# Patient Record
Sex: Male | Born: 1955 | Race: Black or African American | Hispanic: No | Marital: Single | Smoking: Former smoker
Health system: Southern US, Community
[De-identification: ages and names within clinical notes are randomized; demographics above are authoritative.]

## PROBLEM LIST (undated history)

## (undated) DIAGNOSIS — C61 Malignant neoplasm of prostate: Secondary | ICD-10-CM

## (undated) DIAGNOSIS — I10 Essential (primary) hypertension: Secondary | ICD-10-CM

## (undated) DIAGNOSIS — I639 Cerebral infarction, unspecified: Secondary | ICD-10-CM

## (undated) HISTORY — PX: ROTATOR CUFF REPAIR: SHX139

## (undated) HISTORY — PX: OTHER SURGICAL HISTORY: SHX169

---

## 2004-02-29 ENCOUNTER — Encounter: Admission: RE | Admit: 2004-02-29 | Discharge: 2004-02-29 | Payer: Self-pay | Admitting: Family Medicine

## 2005-05-12 ENCOUNTER — Encounter: Admission: RE | Admit: 2005-05-12 | Discharge: 2005-05-12 | Payer: Self-pay | Admitting: General Surgery

## 2005-05-18 ENCOUNTER — Encounter: Admission: RE | Admit: 2005-05-18 | Discharge: 2005-05-18 | Payer: Self-pay | Admitting: General Surgery

## 2005-06-02 ENCOUNTER — Encounter (INDEPENDENT_AMBULATORY_CARE_PROVIDER_SITE_OTHER): Payer: Self-pay | Admitting: *Deleted

## 2005-06-02 ENCOUNTER — Ambulatory Visit (HOSPITAL_BASED_OUTPATIENT_CLINIC_OR_DEPARTMENT_OTHER): Admission: RE | Admit: 2005-06-02 | Discharge: 2005-06-02 | Payer: Self-pay | Admitting: General Surgery

## 2009-07-15 ENCOUNTER — Ambulatory Visit: Admission: RE | Admit: 2009-07-15 | Discharge: 2009-10-13 | Payer: Self-pay | Admitting: Radiation Oncology

## 2009-08-06 ENCOUNTER — Encounter: Admission: RE | Admit: 2009-08-06 | Discharge: 2009-08-06 | Payer: Self-pay | Admitting: Urology

## 2009-09-10 ENCOUNTER — Ambulatory Visit (HOSPITAL_BASED_OUTPATIENT_CLINIC_OR_DEPARTMENT_OTHER): Admission: RE | Admit: 2009-09-10 | Discharge: 2009-09-10 | Payer: Self-pay | Admitting: Urology

## 2009-10-13 ENCOUNTER — Ambulatory Visit
Admission: RE | Admit: 2009-10-13 | Discharge: 2009-10-19 | Payer: Self-pay | Source: Home / Self Care | Admitting: Radiation Oncology

## 2010-06-28 ENCOUNTER — Inpatient Hospital Stay (HOSPITAL_COMMUNITY)
Admission: EM | Admit: 2010-06-28 | Discharge: 2010-06-30 | DRG: 532 | Disposition: A | Discharge status: Home / Self Care | Payer: BC Managed Care – PPO | Attending: Internal Medicine | Admitting: Internal Medicine

## 2010-06-28 ENCOUNTER — Emergency Department (HOSPITAL_COMMUNITY): Payer: BC Managed Care – PPO

## 2010-06-28 DIAGNOSIS — I10 Essential (primary) hypertension: Secondary | ICD-10-CM | POA: Diagnosis present

## 2010-06-28 DIAGNOSIS — E860 Dehydration: Secondary | ICD-10-CM | POA: Diagnosis present

## 2010-06-28 DIAGNOSIS — N179 Acute kidney failure, unspecified: Secondary | ICD-10-CM | POA: Diagnosis present

## 2010-06-28 DIAGNOSIS — R4789 Other speech disturbances: Secondary | ICD-10-CM | POA: Diagnosis present

## 2010-06-28 DIAGNOSIS — G459 Transient cerebral ischemic attack, unspecified: Principal | ICD-10-CM | POA: Diagnosis present

## 2010-06-28 DIAGNOSIS — R55 Syncope and collapse: Secondary | ICD-10-CM | POA: Diagnosis present

## 2010-06-28 DIAGNOSIS — F17200 Nicotine dependence, unspecified, uncomplicated: Secondary | ICD-10-CM | POA: Diagnosis present

## 2010-06-28 DIAGNOSIS — G8190 Hemiplegia, unspecified affecting unspecified side: Secondary | ICD-10-CM | POA: Diagnosis present

## 2010-06-28 DIAGNOSIS — Z8546 Personal history of malignant neoplasm of prostate: Secondary | ICD-10-CM

## 2010-06-28 DIAGNOSIS — E785 Hyperlipidemia, unspecified: Secondary | ICD-10-CM | POA: Diagnosis present

## 2010-06-28 LAB — CBC
HCT: 49.6 % (ref 39.0–52.0)
Hemoglobin: 16.5 g/dL (ref 13.0–17.0)
MCH: 28.6 pg (ref 26.0–34.0)
MCHC: 33.3 g/dL (ref 30.0–36.0)
MCV: 86 fL (ref 78.0–100.0)
Platelets: 249 10*3/uL (ref 150–400)
RBC: 5.77 MIL/uL (ref 4.22–5.81)
RDW: 14.1 % (ref 11.5–15.5)
WBC: 6.7 10*3/uL (ref 4.0–10.5)

## 2010-06-28 LAB — T3: T3, Total: 79.7 ng/dl — ABNORMAL LOW (ref 80.0–204.0)

## 2010-06-28 LAB — DIFFERENTIAL
Basophils Absolute: 0 10*3/uL (ref 0.0–0.1)
Basophils Relative: 0 % (ref 0–1)
Eosinophils Absolute: 0 10*3/uL (ref 0.0–0.7)
Eosinophils Relative: 0 % (ref 0–5)
Lymphocytes Relative: 21 % (ref 12–46)
Lymphs Abs: 1.4 10*3/uL (ref 0.7–4.0)
Monocytes Absolute: 0.3 10*3/uL (ref 0.1–1.0)
Monocytes Relative: 5 % (ref 3–12)
Neutro Abs: 4.9 10*3/uL (ref 1.7–7.7)
Neutrophils Relative %: 73 % (ref 43–77)

## 2010-06-28 LAB — APTT: aPTT: 27 seconds (ref 24–37)

## 2010-06-28 LAB — PROTIME-INR
INR: 0.94 (ref 0.00–1.49)
Prothrombin Time: 12.8 seconds (ref 11.6–15.2)

## 2010-06-28 LAB — POCT I-STAT, CHEM 8
BUN: 25 mg/dL — ABNORMAL HIGH (ref 6–23)
Calcium, Ion: 1.18 mmol/L (ref 1.12–1.32)
Chloride: 108 mEq/L (ref 96–112)
Creatinine, Ser: 2.3 mg/dL — ABNORMAL HIGH (ref 0.4–1.5)
Glucose, Bld: 93 mg/dL (ref 70–99)
HCT: 57 % — ABNORMAL HIGH (ref 39.0–52.0)
Hemoglobin: 19.4 g/dL — ABNORMAL HIGH (ref 13.0–17.0)
Potassium: 4.5 mEq/L (ref 3.5–5.1)
Sodium: 142 mEq/L (ref 135–145)
TCO2: 24 mmol/L (ref 0–100)

## 2010-06-28 LAB — COMPREHENSIVE METABOLIC PANEL
ALT: 16 U/L (ref 0–53)
AST: 21 U/L (ref 0–37)
Albumin: 3.8 g/dL (ref 3.5–5.2)
Alkaline Phosphatase: 115 U/L (ref 39–117)
BUN: 18 mg/dL (ref 6–23)
CO2: 23 mEq/L (ref 19–32)
Calcium: 9.3 mg/dL (ref 8.4–10.5)
Chloride: 108 mEq/L (ref 96–112)
Creatinine, Ser: 1.7 mg/dL — ABNORMAL HIGH (ref 0.4–1.5)
GFR calc Af Amer: 51 mL/min — ABNORMAL LOW (ref >60)
GFR calc non Af Amer: 42 mL/min — ABNORMAL LOW (ref >60)
Glucose, Bld: 88 mg/dL (ref 70–99)
Potassium: 4.5 mEq/L (ref 3.5–5.1)
Sodium: 142 mEq/L (ref 135–145)
Total Bilirubin: 0.7 mg/dL (ref 0.3–1.2)
Total Protein: 7.4 g/dL (ref 6.0–8.3)

## 2010-06-28 LAB — HEMOGLOBIN A1C
Hgb A1c MFr Bld: 6 % — ABNORMAL HIGH (ref <5.7)
Mean Plasma Glucose: 126 mg/dL — ABNORMAL HIGH (ref <117)

## 2010-06-28 LAB — TSH: TSH: 0.583 u[IU]/mL (ref 0.350–4.500)

## 2010-06-28 LAB — CK TOTAL AND CKMB (NOT AT ARMC)
CK, MB: 1 ng/mL (ref 0.3–4.0)
Relative Index: INVALID (ref 0.0–2.5)
Total CK: 69 U/L (ref 7–232)

## 2010-06-28 LAB — T4: T4, Total: 8.5 ug/dL (ref 5.0–12.5)

## 2010-06-28 LAB — TROPONIN I: Troponin I: <0.01 ng/mL (ref 0.00–0.06)

## 2010-06-28 MED ORDER — GADOBENATE DIMEGLUMINE 529 MG/ML IV SOLN
10.0000 mL | Freq: Once | INTRAVENOUS | Status: AC
  Administered 2010-06-28: 10 mL via INTRAVENOUS

## 2010-06-28 NOTE — H&P (Signed)
NAME:  Jermaine Lowery, Jermaine Lowery NO.:  [id number]  MEDICAL RECORD NO.:  [id number]           PATIENT TYPE:  E  LOCATION:  [facility]                         FACILITY:  [facility]  PHYSICIAN:  Talmage Nap, MD  DATE OF BIRTH:  [birth year]  DATE OF ADMISSION:  06/28/2010 DATE OF DISCHARGE:                             HISTORY & PHYSICAL   PRIMARY CARE PHYSICIAN:  [facility].  History obtainable from the patient and the patient's sister.  CHIEF COMPLAINT:  Dizziness and diaphoresis, which occurred at work about 9:00 a.m. on June 28, 2010.  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  When the patient developed these symptoms, he temporarily stopped driving and came down from his truck.  He denied any associated chest pain.  He denied any shortness of breath.  He denied any nausea or vomiting.  He denied any fever.  No chills.  No rigor.  He also denied any slurred speech; however, the patient's sister claimed that the patient's speech was slurred.  He denied any history of passing out.  Symptoms were said to have been very transient and subsequently, the patient was brought to the emergency room to be evaluated.  PAST MEDICAL HISTORY:  Positive for hypertension, not on any medications; hyperlipidemia, not on any medication; prostate CA.  PAST SURGICAL HISTORY: 1. Prostate CA status post radiotherapy and seed implant. 2. Bilateral foot bone spur status post removal. 3. Bilateral rotator cuff surgery. 4. Left knee arthroscopy.  MEDICATIONS:  He is currently not on any medication.  ALLERGIES:  TO NAPROXEN, WHICH PRODUCES HIVES.  FAMILY  HISTORY:  York Spaniel to be positive for diabetes mellitus, hypertension, CVA, and stroke.  SOCIAL HISTORY:  The patient smokes about quarter of a pack of cigarettes per day for over 35 years.  Occasionally takes alcohol. 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  REVIEW OF SYSTEMS:  The patient presently denies any history of headaches.  No blurred vision.  No nausea or vomiting.  No fever.  No chills.  No rigor.  No chest pain or shortness of breath.  No cough.  No abdominal discomfort.  No diarrhea or hematochezia.  No dysuria or hematuria.  No swelling of the lower extremity.  No intolerance to heat or cold and no known psychiatric disorder.  PHYSICAL EXAMINATION:  GENERAL:  Middle-aged man with suboptimal hydration, not in any obvious respiratory distress. VITAL SIGNS:  His present vital signs, blood pressure is 103/76, pulse is 65, respiratory rate 18, temperature is 97.5. HEENT:  Pupils are reactive to light.  Extraocular muscles are intact. NECK:  He has no jugular venous distention.  No carotid bruit.  No lymphadenopathy. CHEST:  Clear to auscultation. HEART:  Heart sounds are 1 and 2. ABDOMEN:  Soft and nontender.  Liver, spleen, kidney, not palpable. Bowel sounds are positive.  EXTREMITIES:  No pedal edema. NEUROLOGIC EXAM:  Shows speech to be appropriate.  Muscle power, right upper extremity 4/5, right lower extremity 4/5, left upper extremity 5/5, left lower extremity 5/5.  Sensory sensation intact and deep tendon reflexes intact and no clonus.  MUSCULOSKELETAL SYSTEM:  Shows arthritic changes in the knees. SKIN:  Showed decreased turgor.  LABORATORY DATA:  Chemistry done on the patient showed sodium of 142, potassium of 4.5, chloride of 118, BUN 25, creatinine is 2.3, sodium is 93.  Hemoglobin is 19.4 and hematocrit is 57.0, most likely secondary to hemoconcentration.  EKG showed sinus rhythm with a rate of 65 with LVH. No acute ST-wave change noted.  CT of the head  without contrast showed chronic lacunar infarction in the right anterior basilar ganglia, internal capsule on the left periventricular white matter.  There is no significant ischemic changes seen and there is no acute infarct seen.  IMPRESSION: 1. Dizziness/questionable cerebrovascular accident with right     hemiparesis. 2. Hypertension. 3. Hyperlipidemia. 4. Chronic kidney disease (baseline creatinine, unknown).  PLAN:  Admit the patient to Neuro Telemetry.  The patient will be followed strictly on the stroke admission orders and part of it will include lipid panel, hemoglobin A1c, TSH, T3, and T4.  The patient will have CBCD stat done and cardiac enzymes q. 6 x3.  Imaging studies to be ordered on this patient will include MRI and MRA of the head and neck, carotid duplex, and 2-D echo.  In the meantime, the patient will be given half-normal saline IV to go at rate of 75 mL an hour to correct his dehydration.  He will also be on aspirin 325 mg p.o. daily, Zocor 20 mg p.o. daily, and amlodipine 5 mg p.o. daily.  He will be on Protonix 40 mg IV q. 24 for GI prophylaxis and heparin 5000 inches subcu q. 8 hourly for DVT prophylaxis.  The patient will be reevaluated with lab results and he will be followed on a daily basis.     Talmage Nap, MD     CN/MEDQ  D:  06/28/2010  T:  06/28/2010  Job:  [id number]  Electronically Signed by Talmage Nap  on 06/28/2010 04:20:22 PM

## 2010-06-29 DIAGNOSIS — I6789 Other cerebrovascular disease: Secondary | ICD-10-CM

## 2010-06-29 LAB — CBC
HCT: 44.5 % (ref 39.0–52.0)
MCV: 85.2 fL (ref 78.0–100.0)
Platelets: 249 10*3/uL (ref 150–400)
RBC: 5.22 MIL/uL (ref 4.22–5.81)
WBC: 4.6 10*3/uL (ref 4.0–10.5)

## 2010-06-29 LAB — LIPID PANEL
Cholesterol: 196 mg/dL (ref 0–200)
HDL: 47 mg/dL (ref >39)
LDL Cholesterol: 133 mg/dL — ABNORMAL HIGH (ref 0–99)
Triglycerides: 81 mg/dL (ref <150)

## 2010-06-29 LAB — BASIC METABOLIC PANEL
BUN: 15 mg/dL (ref 6–23)
Chloride: 105 mEq/L (ref 96–112)
Glucose, Bld: 159 mg/dL — ABNORMAL HIGH (ref 70–99)
Potassium: 3.9 mEq/L (ref 3.5–5.1)

## 2010-06-29 LAB — CARDIAC PANEL(CRET KIN+CKTOT+MB+TROPI)
CK, MB: 1 ng/mL (ref 0.3–4.0)
Relative Index: INVALID (ref 0.0–2.5)
Total CK: 78 U/L (ref 7–232)
Troponin I: <0.01 ng/mL (ref 0.00–0.06)

## 2010-06-30 LAB — CBC
HCT: 43.4 % (ref 39.0–52.0)
Hemoglobin: 14.3 g/dL (ref 13.0–17.0)
MCH: 27.9 pg (ref 26.0–34.0)
MCV: 84.6 fL (ref 78.0–100.0)
Platelets: 226 10*3/uL (ref 150–400)
RBC: 5.13 MIL/uL (ref 4.22–5.81)

## 2010-06-30 LAB — BASIC METABOLIC PANEL
CO2: 24 mEq/L (ref 19–32)
Chloride: 110 mEq/L (ref 96–112)
Creatinine, Ser: 1.3 mg/dL (ref 0.4–1.5)
GFR calc Af Amer: >60 mL/min (ref >60)

## 2010-07-13 NOTE — Discharge Summary (Signed)
NAME:  Jermaine Lowery, Jermaine Lowery NO.:  [id number]  MEDICAL RECORD NO.:  [id number]           PATIENT TYPE:  I  LOCATION:  3040                         FACILITY:  [facility]  PHYSICIAN:  Jermaine Ranger, MD       DATE OF BIRTH:  04-30-1956  DATE OF ADMISSION:  06/28/2010 DATE OF DISCHARGE:  06/30/2010                              DISCHARGE SUMMARY   PRIMARY CARE PHYSICIAN:  [facility].  DISCHARGE DIAGNOSIS: 1. Near syncope with questionable transient ischemic attack. 2. Hypertension. 3. Hyperlipidemia. 4. Dehydration. 5. History of prostate cancer status post radiotherapy and seed     implant. 6. Hyperlipidemia.  DISCHARGE MEDICATIONS: 1. Multivitamin 1 tablet daily. 2. Pravachol 40 mg p.o. daily. 3. Amlodipine 5 mg p.o. daily. 4. Aspirin 325 mg p.o. daily.  HISTORY OF PRESENT ILLNESS:  Mr. 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 The patient stopped driving temporally and came down from his truck.  He denied any history of syncopal episode.  The patient's sister, however, claimed that the patient's speech was somewhat slurred, although he denied any slurring of speech.  RADIOLOGICAL DATA:  CT head without contrast February 28, significant cerebrovascular disease since 2005, no acute infarct.  MRI of the brain showed no acute intracranial abnormality, multifocal chronic small vessel infarct, abnormal bone marrow signal in the cervical spine, is nonspecific.  MRA, minimal bilateral carotid bifurcation, atherosclerosis with no significant cervical carotid artery stenosis. Dominant left vertebral artery is within normal limits, moderate stenosis of the __________ nondominant right vertebral artery, posterior circulation, otherwise negative.  A 2-D  echocardiogram, EF 55-65%, systolic function normal.  Carotid Doppler's were done which showed no bilateral ICA stenosis.  BRIEF HOSPITALIZATION COURSE:  Mr. Jermaine Lowery is a 55 year old male who was admitted with near syncopal episode.  However, at the time of admission there was question of TIA. 1. Near syncope versus TIA, resolved.  The patient underwent stroke     workup which included MRI, MRA which showed no acute infarct.     However, the patient has developed a significant cerebrovascular     disease with chronic ischemia since previous test in 2005.  No     acute infarct.  The patient was started on full-dose aspirin and     should continue in the light of significant cerebrovascular     disease. 2. Hypertension.  The patient was started on amlodipine 5 mg p.o.     daily and his blood pressure has been stable. 3. Hyperlipidemia.  LDL was found to be 133.  The patient was strongly     counseled for diet control as well as started on Pravachol 40 mg     p.o. daily. 4. A PT/OT consult was also done which shows that the patient is back     to his baseline and does not require any assistance.  DISCHARGE PHYSICAL EXAMINATION:  VITAL SIGNS:  Temperature 98, pulse 60,  respirations 20, blood pressure 134/73, O2 sats 98% room air. GENERAL:  The patient is alert, awake and oriented x3 not in acute distress. HEENT:  Anicteric sclerae.  Pink conjunctivae.  Pupils reactive to light and accommodation.  EOMI. NECK:  Supple.  No lymphadenopathy, no JVD. CVS:  S1, S2 clear.  Regular rate and rhythm. CHEST:  Clear to auscultation bilaterally. ABDOMEN:  Soft and nondistended.  Normal bowel sounds. EXTREMITIES:  No cyanosis, clubbing, or edema noted in upper or lower extremities bilaterally.  DISCHARGE FOLLOWUP:  With [facility] within next 1- 2 weeks.  DISCHARGE TIME:  35 minutes.     Jermaine Ranger, MD     RR/MEDQ  D:  06/30/2010  T:  07/01/2010  Job:   [id number]  cc:   Dr. Renato Lowery  Electronically Signed by Jermaine Lowery  on 07/13/2010 07:50:20 AM

## 2010-07-19 LAB — COMPREHENSIVE METABOLIC PANEL
ALT: 21 U/L (ref 0–53)
AST: 25 U/L (ref 0–37)
Albumin: 4 g/dL (ref 3.5–5.2)
Alkaline Phosphatase: 89 U/L (ref 39–117)
BUN: 18 mg/dL (ref 6–23)
CO2: 29 mEq/L (ref 19–32)
Calcium: 9.2 mg/dL (ref 8.4–10.5)
Chloride: 107 mEq/L (ref 96–112)
Creatinine, Ser: 1.27 mg/dL (ref 0.4–1.5)
GFR calc Af Amer: >60 mL/min (ref >60)
GFR calc non Af Amer: 60 mL/min — ABNORMAL LOW (ref >60)
Glucose, Bld: 101 mg/dL — ABNORMAL HIGH (ref 70–99)
Potassium: 4.5 mEq/L (ref 3.5–5.1)
Sodium: 142 mEq/L (ref 135–145)
Total Bilirubin: 0.5 mg/dL (ref 0.3–1.2)
Total Protein: 7.3 g/dL (ref 6.0–8.3)

## 2010-07-19 LAB — CBC
HCT: 44.4 % (ref 39.0–52.0)
Hemoglobin: 14.4 g/dL (ref 13.0–17.0)
MCHC: 32.4 g/dL (ref 30.0–36.0)
MCV: 90.5 fL (ref 78.0–100.0)
Platelets: 231 10*3/uL (ref 150–400)
RBC: 4.9 MIL/uL (ref 4.22–5.81)
RDW: 14.8 % (ref 11.5–15.5)
WBC: 5.8 10*3/uL (ref 4.0–10.5)

## 2010-07-19 LAB — PROTIME-INR
INR: 0.95 (ref 0.00–1.49)
Prothrombin Time: 12.6 seconds (ref 11.6–15.2)

## 2010-07-19 LAB — APTT: aPTT: 28 seconds (ref 24–37)

## 2010-09-16 NOTE — Op Note (Signed)
NAME:  Jermaine Lowery, Jermaine Lowery NO.:  [id number]   MEDICAL RECORD NO.:  [id number]          PATIENT TYPE:  AMB   LOCATION:  [facility]                         [facility]ACILITY:  [facility]   PHYSICIAN:  Leonie Man, M.D.   DATE OF BIRTH:  1956/04/08   DATE OF PROCEDURE:  06/02/2005  DATE OF DISCHARGE:                                 OPERATIVE REPORT   PREOPERATIVE DIAGNOSIS:  Mass left temple region.   POSTOPERATIVE DIAGNOSIS:  Excision of lipoma, left temple region.   SURGEON:  Leonie Man, M.D.   ASSISTANT:  OR tech.   ANESTHESIA:  General.   INDICATIONS:  The patient is a 55 year old man with an enlarging soft mass  behind his left ear.  This is not painful but has been causing some  significant deformity in his scalp.  The patient comes to the operating room  for excision of this mass after the risks and potential benefits of surgery  have been discussed.  All questions answered and consent obtained.   DESCRIPTION OF PROCEDURE:  With the patient positioned supine and then  turned into right lateral recumbent position, following the induction of  satisfactory general anesthesia, the area behind the left ear was prepped  and draped in the sterile operative field.  I infiltrated this with 0.5%  Marcaine with epinephrine and then made an elliptical incision over the  mastia down through the skin and subcutaneous tissues to the capsule of a  large lipoma which is dissected free from the surrounding tissue and removed  in its entirety down to the muscles of the scalp.  Hemostasis was then  obtained with electrocautery.  Sponge and instrument counts were verified  and the incision closed with two layers using interrupted 0 Vicryl sutures  in the subcutaneous tissues and a running 4-0 Vicryl suture in the skin.  This has been reinforced with Steri-Strips and a sterile compressive  dressing applied.  Anesthetic reversed.  The patient removed from the  operating room to the recovery  room in stable condition.  He tolerated the  procedure well.   Dictation ends here.      Leonie Man, M.D.  Electronically Signed     PB/MEDQ  D:  06/02/2005  T:  06/02/2005  Job:  [id number]

## 2013-02-03 ENCOUNTER — Encounter (HOSPITAL_COMMUNITY): Payer: Self-pay | Admitting: *Deleted

## 2013-02-03 ENCOUNTER — Observation Stay (HOSPITAL_COMMUNITY)
Admission: EM | Admit: 2013-02-03 | Discharge: 2013-02-05 | Disposition: A | Discharge status: Home / Self Care | Payer: BC Managed Care – PPO | Attending: Internal Medicine | Admitting: Internal Medicine

## 2013-02-03 ENCOUNTER — Emergency Department (HOSPITAL_COMMUNITY): Payer: BC Managed Care – PPO

## 2013-02-03 ENCOUNTER — Observation Stay (HOSPITAL_COMMUNITY): Payer: BC Managed Care – PPO

## 2013-02-03 DIAGNOSIS — N19 Unspecified kidney failure: Secondary | ICD-10-CM

## 2013-02-03 DIAGNOSIS — R4789 Other speech disturbances: Secondary | ICD-10-CM | POA: Insufficient documentation

## 2013-02-03 DIAGNOSIS — R262 Difficulty in walking, not elsewhere classified: Secondary | ICD-10-CM | POA: Insufficient documentation

## 2013-02-03 DIAGNOSIS — F17200 Nicotine dependence, unspecified, uncomplicated: Secondary | ICD-10-CM | POA: Insufficient documentation

## 2013-02-03 DIAGNOSIS — N179 Acute kidney failure, unspecified: Secondary | ICD-10-CM | POA: Insufficient documentation

## 2013-02-03 DIAGNOSIS — R32 Unspecified urinary incontinence: Secondary | ICD-10-CM | POA: Insufficient documentation

## 2013-02-03 DIAGNOSIS — E785 Hyperlipidemia, unspecified: Secondary | ICD-10-CM | POA: Insufficient documentation

## 2013-02-03 DIAGNOSIS — I10 Essential (primary) hypertension: Secondary | ICD-10-CM | POA: Insufficient documentation

## 2013-02-03 DIAGNOSIS — G901 Familial dysautonomia [Riley-Day]: Secondary | ICD-10-CM

## 2013-02-03 DIAGNOSIS — G459 Transient cerebral ischemic attack, unspecified: Principal | ICD-10-CM | POA: Insufficient documentation

## 2013-02-03 HISTORY — DX: Essential (primary) hypertension: I10

## 2013-02-03 HISTORY — DX: Cerebral infarction, unspecified: I63.9

## 2013-02-03 HISTORY — DX: Malignant neoplasm of prostate: C61

## 2013-02-03 LAB — COMPREHENSIVE METABOLIC PANEL
ALT: 22 U/L (ref 0–53)
Albumin: 4.2 g/dL (ref 3.5–5.2)
Alkaline Phosphatase: 128 U/L — ABNORMAL HIGH (ref 39–117)
BUN: 19 mg/dL (ref 6–23)
Chloride: 102 mEq/L (ref 96–112)
Glucose, Bld: 108 mg/dL — ABNORMAL HIGH (ref 70–99)
Potassium: 4.1 mEq/L (ref 3.5–5.1)
Sodium: 141 mEq/L (ref 135–145)
Total Bilirubin: 0.4 mg/dL (ref 0.3–1.2)

## 2013-02-03 LAB — GLUCOSE, CAPILLARY
Glucose-Capillary: 115 mg/dL — ABNORMAL HIGH (ref 70–99)
Glucose-Capillary: 187 mg/dL — ABNORMAL HIGH (ref 70–99)

## 2013-02-03 LAB — CBC WITH DIFFERENTIAL/PLATELET
Basophils Relative: 0 % (ref 0–1)
Hemoglobin: 17.3 g/dL — ABNORMAL HIGH (ref 13.0–17.0)
Lymphs Abs: 1.5 10*3/uL (ref 0.7–4.0)
Monocytes Relative: 8 % (ref 3–12)
Neutro Abs: 6.7 10*3/uL (ref 1.7–7.7)
Neutrophils Relative %: 75 % (ref 43–77)
Platelets: 241 10*3/uL (ref 150–400)
RBC: 5.87 MIL/uL — ABNORMAL HIGH (ref 4.22–5.81)

## 2013-02-03 MED ORDER — VITAMIN B-1 100 MG PO TABS
100.0000 mg | ORAL_TABLET | Freq: Every day | ORAL | Status: DC
  Administered 2013-02-04 – 2013-02-05 (×2): 100 mg via ORAL
  Filled 2013-02-03 (×2): qty 1

## 2013-02-03 MED ORDER — ADULT MULTIVITAMIN W/MINERALS CH
1.0000 | ORAL_TABLET | Freq: Every day | ORAL | Status: DC
  Filled 2013-02-03: qty 1

## 2013-02-03 MED ORDER — AMLODIPINE BESYLATE 5 MG PO TABS
5.0000 mg | ORAL_TABLET | Freq: Every day | ORAL | Status: DC
  Administered 2013-02-04 – 2013-02-05 (×2): 5 mg via ORAL
  Filled 2013-02-03 (×2): qty 1

## 2013-02-03 MED ORDER — SODIUM CHLORIDE 0.9 % IV SOLN
INTRAVENOUS | Status: DC
  Administered 2013-02-03: 1000 mL via INTRAVENOUS

## 2013-02-03 MED ORDER — LORAZEPAM 1 MG PO TABS: 0.0000 mg | ORAL_TABLET | Freq: Four times a day (QID) | ORAL | Status: DC

## 2013-02-03 MED ORDER — THIAMINE HCL 100 MG/ML IJ SOLN
100.0000 mg | Freq: Every day | INTRAMUSCULAR | Status: DC
  Filled 2013-02-03 (×2): qty 1

## 2013-02-03 MED ORDER — ADULT MULTIVITAMIN W/MINERALS CH
1.0000 | ORAL_TABLET | Freq: Every day | ORAL | Status: DC
  Administered 2013-02-04 – 2013-02-05 (×2): 1 via ORAL
  Filled 2013-02-03 (×2): qty 1

## 2013-02-03 MED ORDER — HYDRALAZINE HCL 20 MG/ML IJ SOLN
10.0000 mg | INTRAMUSCULAR | Status: DC | PRN
Start: 2013-02-03 — End: 2013-02-05

## 2013-02-03 MED ORDER — LORAZEPAM 1 MG PO TABS: 0.0000 mg | ORAL_TABLET | Freq: Two times a day (BID) | ORAL | Status: DC

## 2013-02-03 MED ORDER — LORAZEPAM 1 MG PO TABS: 1.0000 mg | ORAL_TABLET | Freq: Four times a day (QID) | ORAL | Status: DC | PRN

## 2013-02-03 MED ORDER — ASPIRIN 325 MG PO TABS
325.0000 mg | ORAL_TABLET | Freq: Every day | ORAL | Status: DC
  Administered 2013-02-04 – 2013-02-05 (×2): 325 mg via ORAL
  Filled 2013-02-03 (×2): qty 1

## 2013-02-03 MED ORDER — LORAZEPAM 2 MG/ML IJ SOLN: 1.0000 mg | Freq: Four times a day (QID) | INTRAMUSCULAR | Status: DC | PRN

## 2013-02-03 MED ORDER — ENOXAPARIN SODIUM 40 MG/0.4ML ~~LOC~~ SOLN
40.0000 mg | SUBCUTANEOUS | Status: DC
  Administered 2013-02-03 – 2013-02-04 (×2): 40 mg via SUBCUTANEOUS
  Filled 2013-02-03 (×3): qty 0.4

## 2013-02-03 MED ORDER — FOLIC ACID 1 MG PO TABS
1.0000 mg | ORAL_TABLET | Freq: Every day | ORAL | Status: DC
  Administered 2013-02-04 – 2013-02-05 (×2): 1 mg via ORAL
  Filled 2013-02-03 (×2): qty 1

## 2013-02-03 MED ORDER — ACETAMINOPHEN 325 MG PO TABS: 650.0000 mg | ORAL_TABLET | ORAL | Status: DC | PRN

## 2013-02-03 MED ORDER — ATORVASTATIN CALCIUM 20 MG PO TABS
20.0000 mg | ORAL_TABLET | Freq: Every day | ORAL | Status: DC
  Administered 2013-02-04 – 2013-02-05 (×2): 20 mg via ORAL
  Filled 2013-02-03 (×2): qty 1

## 2013-02-03 NOTE — ED Notes (Signed)
Pt taken to mri

## 2013-02-03 NOTE — H&P (Signed)
[facility] History and Physical  Makih Stefanko ZOX:[id number] DOB: [birth year] DOA: 02/03/2013  Referring physician: ER physician. PCP: No primary provider on file. Dr. Renato Gails. [facility].  Chief Complaint: Blurred vision and slurred speech.  HPI: Jermaine Lowery is a 57 y.o. male with known history of hypertension and hyperlipidemia and ongoing tobacco abuse with previous history of TIA was brought to the ER after patient has had blurred vision with slurred speech and difficulty walking. Around 4 PM today patient while doing his laundry started experiencing blurred vision in both eyes. When his sister came around 5 PM to visit she noticed that patient also had some difficulty speaking with slurring of speech. Patient also had some difficulty walking with gait imbalance. Symptoms improved by 5:30 PM. In the ER patient was found to be nonfocal. CT head was negative for anything acute. Patient has been admitted for further TIA workup. Neurologist has been consulted. Patient otherwise denies any chest pain shortness of breath. Denies any weakness of the upper or lower extremities or any headache or difficulty swallowing or any incontinence of urine or bowel.  Review of Systems: As presented in the history of presenting illness, rest negative.  Past Medical History  Diagnosis Date  . Stroke   . Hypertension   . Prostate cancer    Past Surgical History  Procedure Laterality Date  . Rotator cuff repair    . Prostate cacner     Social History:  reports that he has been smoking.  He does not have any smokeless tobacco history on file. He reports that he does not drink alcohol or use illicit drugs. Home. where does patient live-- Can do ADLs. Can patient participate in ADLs?  Allergies  Allergen Reactions  . Naproxen Rash    Family History  Problem Relation Age of Onset  . CAD Mother   . Diabetes Mellitus II Mother   . Diabetes Mellitus II Brother   . Stroke  Paternal Uncle       Prior to Admission medications   Medication Sig Start Date End Date Taking? Authorizing Provider  amLODipine (NORVASC) 5 MG tablet Take 5 mg by mouth daily.   Yes Historical Provider, MD  aspirin EC 81 MG tablet Take 81 mg by mouth daily.   Yes Historical Provider, MD  atorvastatin (LIPITOR) 20 MG tablet Take 20 mg by mouth daily.   Yes Historical Provider, MD  Multiple Vitamin (MULTIVITAMIN WITH MINERALS) TABS tablet Take 1 tablet by mouth daily.   Yes Historical Provider, MD   Physical Exam: Filed Vitals:   02/03/13 1740 02/03/13 1915 02/03/13 1944  BP: 141/86 170/106   Pulse: 76    Temp: 98.6 F (37 C) 98.1 F (36.7 C) 98 F (36.7 C)  TempSrc: Oral Oral   Resp: 18 15   Weight: 72.774 kg (160 lb 7 oz)    SpO2: 99% 100%      General:  Well-developed and nourished.  Eyes: Anicteric no pallor.  ENT: No discharge from ears eyes nose mouth.  Neck: No mass felt.  Cardiovascular: S1-S2 heard.  Respiratory: No rhonchi or crepitations.  Abdomen: Soft nontender bowel sounds present.  Skin: No rash.  Musculoskeletal: No edema.  Psychiatric: Appears normal.  Neurologic: Alert awake oriented to time place and person. Moves all extremities 5 x 5. No facial asymmetry. Tongue is midline. PERRLA positive.  Labs on Admission:  Basic Metabolic Panel:  Recent Labs Lab 02/03/13 1746  NA 141  K 4.1  CL 102  CO2 23  GLUCOSE 108*  BUN 19  CREATININE 1.74*  CALCIUM 9.6   Liver Function Tests:  Recent Labs Lab 02/03/13 1746  AST 23  ALT 22  ALKPHOS 128*  BILITOT 0.4  PROT 8.3  ALBUMIN 4.2   No results found for this basename: LIPASE, AMYLASE,  in the last 168 hours No results found for this basename: AMMONIA,  in the last 168 hours CBC:  Recent Labs Lab 02/03/13 1746  WBC 8.9  NEUTROABS 6.7  HGB 17.3*  HCT 48.6  MCV 82.8  PLT 241   Cardiac Enzymes: No results found for this basename: CKTOTAL, CKMB, CKMBINDEX, TROPONINI,  in the  last 168 hours  BNP (last 3 results) No results found for this basename: PROBNP,  in the last 8760 hours CBG:  Recent Labs Lab 02/03/13 1744  GLUCAP 115*    Radiological Exams on Admission: Ct Head Wo Contrast  02/03/2013   CLINICAL DATA:  Sudden nausea and dizziness with blurry vision started about 2 hr ago  EXAM: CT HEAD WITHOUT CONTRAST  TECHNIQUE: Contiguous axial images were obtained from the base of the skull through the vertex without intravenous contrast.  COMPARISON:  06/28/2010  FINDINGS: There are chronic lacunar infarcts in the bilateral basal ganglia. The majority of these were present previously. A few lacunar infarcts are new when compared to the prior study but nonetheless demonstrate a chronic appearance. There is no evidence of vascular territory infarct. There is no hemorrhage or extra-axial fluid.  IMPRESSION: Chronic ischemic change with numerous chronic deep white matter and basal ganglia lacunar infarcts.   Electronically Signed   By: Esperanza Heir M.D.   On: 02/03/2013 18:36    EKG: Independently reviewed. Normal sinus rhythm.  Assessment/Plan Principal Problem:   TIA (transient ischemic attack) Active Problems:   Renal failure   HTN (hypertension)   HLD (hyperlipidemia)   1. Possible TIA - neurology has been consulted. Patient has been placed on neurochecks. MRI/MRA brain 2-D echo and carotid Dopplers has been ordered. Aspirin. Further recommendations per neurologist. 2. Hypertension uncontrolled - patient is on amlodipine. Patient also has been placed on when necessary IV hydralazine for systolic blood pressure more than 220. Since patient may be having an acute event of possible stroke permissive hypertension. 3. Renal failure probably chronic - UA is pending. Closely follow metabolic panel. 4. Hyperlipidemia - check lipid panel. 5. Tobacco abuse - strongly advised to quit smoking.  Questionable history of alcoholism. Family states he has been drinking a  lot of alcohol, though patient denies. Patient has been placed on plan Ativan.    Code Status: Full code.  Family Communication: Family at the bedside.  Disposition Plan: Admit for observation.    [name] N. [facility] Pager [phone].  If 7PM-7AM, please contact night-coverage [email or web address] Password [email or web address] 02/03/2013, 8:39 PM

## 2013-02-03 NOTE — ED Notes (Signed)
Pt in MRI.

## 2013-02-03 NOTE — ED Notes (Signed)
MD Walden at bedside 

## 2013-02-03 NOTE — ED Provider Notes (Signed)
CSN: [id number]     Arrival date & time 02/03/13  1730 History   First MD Initiated Contact with Patient 02/03/13 1856     Chief Complaint  Patient presents with  . Dizziness   (Consider location/radiation/quality/duration/timing/severity/associated sxs/prior Treatment) HPI Comments: One episode of dizziness, blurry vision in both eyes, nausea earlier today. Had some associated slurred speech and urinary incontinence also.  Patient is a 57 y.o. male presenting with neurologic complaint. The history is provided by the patient.  Neurologic Problem This is a recurrent problem. The current episode started 1 to 2 hours ago. Episode frequency: once. The problem has been resolved. Pertinent negatives include no chest pain, no abdominal pain, no headaches and no shortness of breath. Nothing aggravates the symptoms. Nothing relieves the symptoms.    Past Medical History  Diagnosis Date  . Stroke   . Hypertension    History reviewed. No pertinent past surgical history. No family history on file. History  Substance Use Topics  . Smoking status: Current Every Day Smoker  . Smokeless tobacco: Not on file  . Alcohol Use: No    Review of Systems  Constitutional: Negative for fever.  Respiratory: Negative for cough and shortness of breath.   Cardiovascular: Negative for chest pain.  Gastrointestinal: Negative for vomiting and abdominal pain.  Neurological: Negative for headaches.  All other systems reviewed and are negative.    Allergies  Naproxen  Home Medications  No current outpatient prescriptions on file. BP 170/106  Pulse 76  Temp(Src) 98.1 F (36.7 C) (Oral)  Resp 15  Wt 160 lb 7 oz (72.774 kg)  SpO2 100% Physical Exam  Nursing note and vitals reviewed. Constitutional: He is oriented to person, place, and time. He appears well-developed and well-nourished. No distress.  HENT:  Head: Normocephalic and atraumatic.  Mouth/Throat: No oropharyngeal exudate.  Eyes: EOM are  normal. Pupils are equal, round, and reactive to light.  Neck: Normal range of motion. Neck supple.  Cardiovascular: Normal rate and regular rhythm.  Exam reveals no friction rub.   No murmur heard. Pulmonary/Chest: Effort normal and breath sounds normal. No respiratory distress. He has no wheezes. He has no rales.  Abdominal: He exhibits no distension. There is no tenderness. There is no rebound.  Musculoskeletal: Normal range of motion. He exhibits no edema.  Neurological: He is alert and oriented to person, place, and time. No cranial nerve deficit. He exhibits normal muscle tone. Coordination normal.  Skin: He is not diaphoretic.    ED Course  Procedures (including critical care time) Labs Review Labs Reviewed  CBC WITH DIFFERENTIAL - Abnormal; Notable for the following:    RBC 5.87 (*)    Hemoglobin 17.3 (*)    All other components within normal limits  COMPREHENSIVE METABOLIC PANEL - Abnormal; Notable for the following:    Glucose, Bld 108 (*)    Creatinine, Ser 1.74 (*)    Alkaline Phosphatase 128 (*)    GFR calc non Af Amer 42 (*)    GFR calc Af Amer 49 (*)    All other components within normal limits  GLUCOSE, CAPILLARY - Abnormal; Notable for the following:    Glucose-Capillary 115 (*)    All other components within normal limits   Imaging Review Ct Head Wo Contrast  02/03/2013   CLINICAL DATA:  Sudden nausea and dizziness with blurry vision started about 2 hr ago  EXAM: CT HEAD WITHOUT CONTRAST  TECHNIQUE: Contiguous axial images were obtained from the base of the  skull through the vertex without intravenous contrast.  COMPARISON:  06/28/2010  FINDINGS: There are chronic lacunar infarcts in the bilateral basal ganglia. The majority of these were present previously. A few lacunar infarcts are new when compared to the prior study but nonetheless demonstrate a chronic appearance. There is no evidence of vascular territory infarct. There is no hemorrhage or extra-axial fluid.   IMPRESSION: Chronic ischemic change with numerous chronic deep white matter and basal ganglia lacunar infarcts.   Electronically Signed   By: Esperanza Heir M.D.   On: 02/03/2013 18:36    MDM   1. TIA (transient ischemic attack)   2. HLD (hyperlipidemia)   3. HTN (hypertension)   4. Renal failure    50M with hx of EtOH use, HTN, TIA presents with one episode of blurry vision, dizziness, nausea, and slurred speech. Patient states this resolved. Per sisters, he also had an episode of urinary incontinence. All sympoms have resolved. AFVSS here, relaxing comfortably. Not exerting himself initially during this episode. Patient with normal cranial nerves, normal strength and sensation in all extremities, normal coordination. Initial Head CT normal. MR ordered, medicine consulted for TIA and admitting.     Dagmar Hait, MD 02/04/13 0040

## 2013-02-03 NOTE — ED Provider Notes (Signed)
MSE was initiated and I personally evaluated the patient and placed orders (if any) at  5:50 PM on February 03, 2013.  The patient appears stable so that the remainder of the MSE may be completed by another provider.  Pt is a 57 y/o male with hx of occasional heavy etoh use (according to sister), as well as history of possible TIA - he states that while folding clothes this afternoon - he became acutely light headed - felt as though his vision was closing in and going black - then he became weak - called his sister who stated that his speech sounded slurred - when they met she found that he had urinated on himself in the car on the drive here. His speech was back to normal. She noted that he could not walk without holding onto the railings b/c of being very off balance.   Will move to back, CT likely indicated, labs, w/u - on my exam in the room he has no focal weakness of all 4 extremities, speech is clear and finger nose finger is normal - CN 3-12  nml - gait not tested at this time.   Vida Roller, MD 02/03/13 [phone]

## 2013-02-03 NOTE — ED Notes (Signed)
Dr. Hyacinth Meeker saw patient at triage and do not need to do Head CT

## 2013-02-03 NOTE — ED Notes (Signed)
Neurology md is at bedside.

## 2013-02-03 NOTE — Consult Note (Signed)
Referring Physician: Dr. Gwendolyn Grant    Chief Complaint: Dizziness and blurred vision as well as nausea and slurred speech.  HPI: Jermaine Lowery is an 57 y.o. male a history of hypertension and hyperlipidemia as well as TIA or stroke in 2012, presenting with a recurrent spell of nausea and blurred vision with dizziness as well as slurred speech. Patient had similar presentation in 2012. Symptoms lasted 10-15 minutes. He was incontinent of urine during a spell. Despite being symptomatic he drove for about 20 minutes to his sister's house. Symptoms have resolved when he arrived. CT scan of his head showed bilateral old basal ganglia lacunar infarctions, was otherwise unremarkable. His family describes previous symptoms of the same nature as well as associated urinary incontinence over the past couple of years. Patient describes symptoms as starting with nausea and visual change followed by dizziness and slurred speech. He's been described as less responsive and somewhat confused during the spells. NIH stroke score to time this evaluation 0.  LSN: 4:30 PM on 02/03/2013 tPA Given: No: Symptoms resolved MRankin: 0  Past Medical History  Diagnosis Date  . Stroke   . Hypertension   . Prostate cancer     Family History  Problem Relation Age of Onset  . CAD Mother   . Diabetes Mellitus II Mother   . Diabetes Mellitus II Brother   . Stroke Paternal Uncle      Medications: I have reviewed the patient's current medications.  ROS: History obtained from sibling and the patient  General ROS: negative for - chills, fatigue, fever, night sweats, weight gain or weight loss Psychological ROS: negative for - behavioral disorder, hallucinations, memory difficulties, mood swings or suicidal ideation Ophthalmic ROS: negative for - blurry vision, double vision, eye pain or loss of vision ENT ROS: negative for - epistaxis, nasal discharge, oral lesions, sore throat, tinnitus or vertigo Allergy and Immunology  ROS: negative for - hives or itchy/watery eyes Hematological and Lymphatic ROS: negative for - bleeding problems, bruising or swollen lymph nodes Endocrine ROS: negative for - galactorrhea, hair pattern changes, polydipsia/polyuria or temperature intolerance Respiratory ROS: negative for - cough, hemoptysis, shortness of breath or wheezing Cardiovascular ROS: negative for - chest pain, dyspnea on exertion, edema or irregular heartbeat Gastrointestinal ROS: negative for - abdominal pain, diarrhea, hematemesis, nausea/vomiting or stool incontinence Genito-Urinary ROS: negative for - dysuria, hematuria, incontinence or urinary frequency/urgency Musculoskeletal ROS: negative for - joint swelling or muscular weakness Neurological ROS: as noted in HPI Dermatological ROS: negative for rash and skin lesion changes  Physical Examination: Blood pressure 170/106, pulse 76, temperature 98 F (36.7 C), temperature source Oral, resp. rate 15, weight 72.774 kg (160 lb 7 oz), SpO2 100.00%.  Neurologic Examination: Mental Status: Alert, oriented, thought content appropriate.  Speech fluent without evidence of aphasia. Able to follow commands without difficulty. Cranial Nerves: II-Visual fields were normal. III/IV/VI-Pupils were equal and reacted. Extraocular movements were full and conjugate.    V/VII-no facial numbness and no facial weakness. VIII-normal. X-normal speech and symmetrical palatal movement. Motor: 5/5 bilaterally with normal tone and bulk Sensory: Normal throughout. Deep Tendon Reflexes: 2+ and symmetric. Plantars: Flexor bilaterally Cerebellar: Normal finger-to-nose testing. Carotid auscultation: Normal  Ct Head Wo Contrast  02/03/2013   CLINICAL DATA:  Sudden nausea and dizziness with blurry vision started about 2 hr ago  EXAM: CT HEAD WITHOUT CONTRAST  TECHNIQUE: Contiguous axial images were obtained from the base of the skull through the vertex without intravenous contrast.   COMPARISON:  06/28/2010  FINDINGS: There are chronic lacunar infarcts in the bilateral basal ganglia. The majority of these were present previously. A few lacunar infarcts are new when compared to the prior study but nonetheless demonstrate a chronic appearance. There is no evidence of vascular territory infarct. There is no hemorrhage or extra-axial fluid.  IMPRESSION: Chronic ischemic change with numerous chronic deep white matter and basal ganglia lacunar infarcts.   Electronically Signed   By: Esperanza Heir M.D.   On: 02/03/2013 18:36    Assessment: 57 y.o. male with a history of previous stroke or TIA as well as CT evidence of multiple lacunar infarctions, history of hypertension and hyperlipidemia, presenting with recurrent symptoms of unclear nature, possibly TIAs. However, complex partial seizure disorder cannot be ruled out, particularly with repetitive nature of his symptoms with predictable sequence.  Stroke Risk Factors - hyperlipidemia and hypertension  Plan: 1. HgbA1c, fasting lipid panel 2. MRI, MRA  of the brain without contrast 3. PT consult, OT consult, Speech consult 4. Echocardiogram 5. Carotid dopplers 6. Prophylactic therapy-Antiplatelet med: Aspirin 81 mg per day and 7. Risk factor modification 8. EEG to rule out partial seizure disorder   C.R. Roseanne Reno, MD [facility]  02/03/2013, 8:39 PM

## 2013-02-03 NOTE — ED Notes (Signed)
Pt states suddenly got nauseated, dizzy, and vision is blurry, and off balance, possible incontinence.  Started at [employer]

## 2013-02-04 ENCOUNTER — Observation Stay (HOSPITAL_COMMUNITY): Payer: BC Managed Care – PPO

## 2013-02-04 DIAGNOSIS — I517 Cardiomegaly: Secondary | ICD-10-CM

## 2013-02-04 LAB — COMPREHENSIVE METABOLIC PANEL
Albumin: 3.5 g/dL (ref 3.5–5.2)
BUN: 19 mg/dL (ref 6–23)
CO2: 24 mEq/L (ref 19–32)
Calcium: 9 mg/dL (ref 8.4–10.5)
Chloride: 103 mEq/L (ref 96–112)
Creatinine, Ser: 1.31 mg/dL (ref 0.50–1.35)
GFR calc non Af Amer: 59 mL/min — ABNORMAL LOW (ref >90)
Glucose, Bld: 89 mg/dL (ref 70–99)
Potassium: 3.6 mEq/L (ref 3.5–5.1)
Total Bilirubin: 0.4 mg/dL (ref 0.3–1.2)

## 2013-02-04 LAB — LIPID PANEL
Cholesterol: 213 mg/dL — ABNORMAL HIGH (ref 0–200)
HDL: 45 mg/dL (ref >39)
LDL Cholesterol: 145 mg/dL — ABNORMAL HIGH (ref 0–99)
Total CHOL/HDL Ratio: 4.7 RATIO
Triglycerides: 117 mg/dL (ref <150)
VLDL: 23 mg/dL (ref 0–40)

## 2013-02-04 LAB — RAPID URINE DRUG SCREEN, HOSP PERFORMED
Amphetamines: NOT DETECTED
Barbiturates: NOT DETECTED
Benzodiazepines: NOT DETECTED
Cocaine: NOT DETECTED
Opiates: NOT DETECTED

## 2013-02-04 LAB — GLUCOSE, CAPILLARY
Glucose-Capillary: 81 mg/dL (ref 70–99)
Glucose-Capillary: 135 mg/dL — ABNORMAL HIGH (ref 70–99)

## 2013-02-04 LAB — CBC WITH DIFFERENTIAL/PLATELET
Basophils Absolute: 0 10*3/uL (ref 0.0–0.1)
Hemoglobin: 15.1 g/dL (ref 13.0–17.0)
Lymphocytes Relative: 34 % (ref 12–46)
Lymphs Abs: 2.4 10*3/uL (ref 0.7–4.0)
Monocytes Relative: 11 % (ref 3–12)
Neutro Abs: 3.9 10*3/uL (ref 1.7–7.7)
Neutrophils Relative %: 54 % (ref 43–77)
Platelets: 246 10*3/uL (ref 150–400)
RDW: 14.5 % (ref 11.5–15.5)
WBC: 7.3 10*3/uL (ref 4.0–10.5)

## 2013-02-04 LAB — URINALYSIS, ROUTINE W REFLEX MICROSCOPIC
Glucose, UA: NEGATIVE mg/dL
Hgb urine dipstick: NEGATIVE
Ketones, ur: 15 mg/dL — AB
Nitrite: NEGATIVE
Protein, ur: NEGATIVE mg/dL
pH: 5.5 (ref 5.0–8.0)

## 2013-02-04 LAB — TSH: TSH: 0.708 u[IU]/mL (ref 0.350–4.500)

## 2013-02-04 LAB — HEMOGLOBIN A1C: Hgb A1c MFr Bld: 5.9 % — ABNORMAL HIGH (ref <5.7)

## 2013-02-04 NOTE — Progress Notes (Signed)
[facility] PROGRESS NOTE  Jermaine Lowery UJW:[id number] DOB: Jan 07, 1956 DOA: 02/03/2013 PCP: No primary provider on file.  Assessment/Plan: 1. TIA vs presyncope vs Seizure activity - Neurology on board - EEG pending, carotid dopplers and echo pending - MRI brain: No acute intracranial process - MRA head: Stable intracranial MRA since 2012. Negative anterior circulation.  Chronically diminutive and irregular distal right vertebral artery.  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  - CT of head Chronic ischemic change with numerous chronic deep white matter and basal ganglia lacunar infarcts  2. HTN - Patient is on amlodipine, Patient placed on when necessary IV hydralazine for elevated SBP > 220 - Continue to monitor and adjust antihypertensive medication pending values   3. Renal failure - Most likely prerenal and improved after IVF's and improved oral intake with S creatinine at 1.3 on last check.  4. HPL - stable on lipitor currently  Code Status: full Family Communication: no family at bedside Disposition Plan: Pending further work up and continued recommendations from Neurology.   Consultants:  Neurology  Procedures:  As listed above.  Antibiotics:  None  HPI/Subjective: Patient has no new complaints today. No acute issues reported overnight.  Objective: Filed Vitals:   02/04/13 0915  BP: 149/95  Pulse: 73  Temp: 98.2 F (36.8 C)  Resp: 16   No intake or output data in the 24 hours ending 02/04/13 1620 Filed Weights   02/03/13 1740 02/03/13 2229  Weight: 72.774 kg (160 lb 7 oz) 75.161 kg (165 lb 11.2 oz)    Exam:   General:  Pt in NAD, alert and Awake  Cardiovascular: RRR, no MRG  Respiratory: CTA BL, no wheezes  Abdomen: soft, NT, ND  Musculoskeletal: warm and dry   Data Reviewed: Basic Metabolic Panel:  Recent Labs Lab 02/03/13 1746 02/04/13 0615  NA 141 138  K 4.1 3.6  CL 102 103   CO2 23 24  GLUCOSE 108* 89  BUN 19 19  CREATININE 1.74* 1.31  CALCIUM 9.6 9.0   Liver Function Tests:  Recent Labs Lab 02/03/13 1746 02/04/13 0615  AST 23 22  ALT 22 20  ALKPHOS 128* 107  BILITOT 0.4 0.4  PROT 8.3 7.0  ALBUMIN 4.2 3.5   No results found for this basename: LIPASE, AMYLASE,  in the last 168 hours No results found for this basename: AMMONIA,  in the last 168 hours CBC:  Recent Labs Lab 02/03/13 1746 02/04/13 0615  WBC 8.9 7.3  NEUTROABS 6.7 3.9  HGB 17.3* 15.1  HCT 48.6 44.8  MCV 82.8 83.0  PLT 241 246   Cardiac Enzymes: No results found for this basename: CKTOTAL, CKMB, CKMBINDEX, TROPONINI,  in the last 168 hours BNP (last 3 results) No results found for this basename: PROBNP,  in the last 8760 hours CBG:  Recent Labs Lab 02/03/13 1744 02/03/13 2337 02/04/13 0643 02/04/13 1105  GLUCAP 115* 187* 81 102*    No results found for this or any previous visit (from the past 240 hour(s)).   Studies: Ct Head Wo Lowery  02/03/2013   CLINICAL DATA:  Sudden nausea and dizziness with blurry vision started about 2 hr ago  EXAM: CT HEAD WITHOUT Lowery  TECHNIQUE: Contiguous axial images were obtained from the base of the skull through the vertex without intravenous Lowery.  COMPARISON:  06/28/2010  FINDINGS: There are chronic lacunar infarcts in the bilateral basal ganglia. The majority of these were present previously. A few  lacunar infarcts are new when compared to the prior study but nonetheless demonstrate a chronic appearance. There is no evidence of vascular territory infarct. There is no hemorrhage or extra-axial fluid.  IMPRESSION: Chronic ischemic change with numerous chronic deep white matter and basal ganglia lacunar infarcts.   Electronically Signed   By: Esperanza Heir M.D.   On: 02/03/2013 18:36   Jermaine Lowery  02/03/2013   *RADIOLOGY REPORT*  Clinical Data: Dizziness and blurred vision.  MRI HEAD WITHOUT Lowery  Technique:   Multiplanar, multiecho pulse sequences of the brain and surrounding structures were obtained according to standard protocol without intravenous Lowery.  Comparison: CT of the head February 03, 2013 at 1830 hours.  Findings: No reduced diffusion to suggest acute ischemia.  Coronal T2 is moderately motion degraded.  Remote right basal ganglia infarct with minimal susceptibility artifact which may reflect mineralization. Surrounding T2 hyperintense gliosis.  6 mm remote left basal ganglia lacunar infarct, in addition to remote left thalamic 1 cm infarct.  Patchy to confluence supratentorial white matter greater than expected for age.  3 mm of left to right midline shift, on ex vacuo basis without mass lesions.  No abnormal intracranial intrinsic T1 shortening.  No abnormal extra-axial fluid collections.  Normal major intracranial vascular flow voids seen at the skull base.  No paranasal sinus air-fluid levels.  Ocular globes and orbital contents are not suspicious.  Mild bilateral temporomandibular osteoarthrosis. No suspicious calvarial bone marrow signal.  IMPRESSION: No acute intracranial process.  Remote right basal ganglia hemorrhagic infarct with additional left basal ganglia and left thalamic remote lacunar infarcts.  Moderate to severe white matter changes suggest chronic small vessel ischemic disease.   Original Report Authenticated By: Awilda Metro   Jermaine Mra Head/brain Wo Cm  02/04/2013   CLINICAL DATA:  57 year old male with blurred vision and slurred speech.  EXAM: MRA HEAD WITHOUT Lowery  TECHNIQUE: MRA HEAD WITHOUT Lowery  COMPARISON:  Brain MRI 02/03/2013. Intracranial MRA 06/28/2010.  FINDINGS: Stable antegrade flow signal in the posterior circulation with dominant distal left vertebral artery. Diminutive and irregular distal right vertebral artery appear stable. Stable vertebrobasilar junction. Patent basilar artery without stenosis. SCA and PCA origins remain normal. Left PCA branches are  stable and within normal limits. Moderate irregularity and stenosis of the right PCA P2 segment is stable. Distal right PCA flow is stable. Posterior communicating arteries are diminutive or absent as before.  Stable antegrade flow in both ICA siphons. No ICA siphon stenosis. Ophthalmic artery origins are within normal limits. Stable carotid termini. MCA and ACA origins are within normal limits.  Mildly tortuous proximal ACA is. Anterior communicating artery and visualized ACA branches are within normal limits. Visualized bilateral MCA branches are stable and within normal limits.  IMPRESSION: Stable intracranial MRA since 2012. Negative anterior circulation. Chronically diminutive and irregular distal right vertebral artery. 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   Electronically Signed   By: Augusto Gamble M.D.   On: 02/04/2013 08:08    Scheduled Meds: . amLODipine  5 mg Oral Daily  . aspirin  325 mg Oral Daily  . atorvastatin  20 mg Oral Daily  . enoxaparin (LOVENOX) injection  40 mg Subcutaneous Q24H  . folic acid  1 mg Oral Daily  . LORazepam  0-4 mg Oral Q6H   Followed by  . [START ON 02/05/2013] LORazepam  0-4 mg Oral Q12H  . multivitamin with minerals  1 tablet Oral Daily  .  thiamine  100 mg Oral Daily   Or  . thiamine  100 mg Intravenous Daily   Continuous Infusions: . sodium chloride 20 mL/hr at 02/04/13 0915    Principal Problem:   TIA (transient ischemic attack) Active Problems:   Renal failure   HTN (hypertension)   HLD (hyperlipidemia)    Time spent: > 35 minutes    Penny Pia  [facility] Pager [phone]. If 7PM-7AM, please contact night-coverage at [email or web address], password [facility] 02/04/2013, 4:20 PM  LOS: 1 day

## 2013-02-04 NOTE — Progress Notes (Signed)
UR completed; B Bayley Yarborough RN,BSN,MHA 

## 2013-02-04 NOTE — Progress Notes (Signed)
*  PRELIMINARY RESULTS* Vascular Ultrasound Carotid Duplex (Doppler) has been completed.   Findings suggest 1-39% internal carotid artery stenosis bilaterally. Vertebral arteries are patent with antegrade flow.  02/04/2013 3:14 PM Gertie Fey, RVT, RDCS, RDMS

## 2013-02-04 NOTE — Progress Notes (Signed)
Subjective: No events overnight  Exam: Filed Vitals:   02/04/13 0915  BP: 149/95  Pulse: 73  Temp: 98.2 F (36.8 C)  Resp: 16   Gen: In bed, NAD MS: Awake, alert, interactive and appropriate CN: Pupils equal round and reactive to light, extraocular movements intact Motor: Strength is full throughout Sensory: Intact to light touch  Impression: 58 year old male with episode of flushing, nausea, lightheadedness as well as possible confusion. He has had multiple episodes similar to this in the past. He states that he sweats a lot with these episodes. With repeated nature of these episodes, I don't think that TIAs are likely. Presyncope would be one possibility,? Vagal reaction with flushed sensation. Complex partial seizures, though possible, are not at all definite by the history. I would favor only treating for seizures if the EEG shows evidence of seizure predisposition.  Recommendations: 1) EEG 2) will followup carotid Dopplers, echo   Ritta Slot, MD [facility] [phone]  If 7pm- 7am, please page neurology on call at [phone].

## 2013-02-04 NOTE — Progress Notes (Signed)
  Echocardiogram 2D Echocardiogram has been performed.  Jermaine Lowery 02/04/2013, 2:38 PM

## 2013-02-04 NOTE — Progress Notes (Signed)
Pt finally voided, 800 cc at 1600

## 2013-02-04 NOTE — Progress Notes (Signed)
EEG completed; results pending.    

## 2013-02-04 NOTE — Procedures (Signed)
History: 57 year old male with recurrent spells.  Background: The background consists of intermixed alpha and beta activities. There is a well defined posterior dominant rhythm of 11 Hz that attenuates with eye opening. There was no drowsiness or sleep recorded.  Photic stimulation: Physiologic driving is not performed  EEG Abnormalities: None  Clinical Interpretation: This normal EEG is recorded in the waking state. There was no seizure or seizure predisposition recorded on this study.   Ritta Slot, MD [facility] [phone]  If 7pm- 7am, please page neurology on call at [phone].

## 2013-02-05 DIAGNOSIS — G909 Disorder of the autonomic nervous system, unspecified: Secondary | ICD-10-CM

## 2013-02-05 DIAGNOSIS — N179 Acute kidney failure, unspecified: Secondary | ICD-10-CM

## 2013-02-05 LAB — GLUCOSE, CAPILLARY
Glucose-Capillary: 93 mg/dL (ref 70–99)
Glucose-Capillary: 89 mg/dL (ref 70–99)

## 2013-02-05 NOTE — Progress Notes (Signed)
NEURO HOSPITALIST PROGRESS NOTE   SUBJECTIVE:                                                                                                                        Stated that he is feeling better today. Reports no HA, vertigo, double vision, difficulty swallowing, focal weakness or numbness, slurred speech, language or vision impairment. EEG normal. Carotid ultrasound unimpressive.   OBJECTIVE:                                                                                                                           Vital signs in last 24 hours: Temp:  [97.6 F (36.4 C)-98.5 F (36.9 C)] 98.1 F (36.7 C) (10/08 0935) Pulse Rate:  [59-84] 67 (10/08 0935) Resp:  [17-18] 18 (10/08 0935) BP: (136-156)/(84-96) 156/91 mmHg (10/08 0935) SpO2:  [99 %-100 %] 100 % (10/08 0935)  Intake/Output from previous day:   Intake/Output this shift:   Nutritional status: Cardiac  Past Medical History  Diagnosis Date  . Stroke   . Hypertension   . Prostate cancer     Neurologic Exam:  Mental Status: Alert, oriented, thought content appropriate.  Speech fluent without evidence of aphasia.  Able to follow 3 step commands without difficulty. Cranial Nerves: II: Discs flat bilaterally; Visual fields grossly normal, pupils equal, round, reactive to light and accommodation III,IV, VI: ptosis not present, extra-ocular motions intact bilaterally V,VII: smile symmetric, facial light touch sensation normal bilaterally VIII: hearing normal bilaterally IX,X: gag reflex present XI: bilateral shoulder shrug XII: midline tongue extension Motor: Right : Upper extremity   5/5    Left:     Upper extremity   5/5  Lower extremity   5/5     Lower extremity   5/5 Tone and bulk:normal tone throughout; no atrophy noted Sensory: Pinprick and light touch intact throughout, bilaterally Deep Tendon Reflexes:  Right: Upper Extremity   Left: Upper extremity   biceps (C-5 to  C-6) 2/4   biceps (C-5 to C-6) 2/4 tricep (C7) 2/4    triceps (C7) 2/4 Brachioradialis (C6) 2/4  Brachioradialis (C6) 2/4  Lower Extremity Lower Extremity  quadriceps (L-2 to L-4) 2/4   quadriceps (L-2 to L-4) 2/4 Achilles (S1) 2/4  Achilles (S1) 2/4  Plantars: Right: downgoing   Left: downgoing Cerebellar: normal finger-to-nose,  normal heel-to-shin test Gait:  No ataxia. CV: pulses palpable throughout    Lab Results: Lab Results  Component Value Date/Time   CHOL 213* 02/04/2013  6:15 AM   Lipid Panel  Recent Labs  02/04/13 0615  CHOL 213*  TRIG 117  HDL 45  CHOLHDL 4.7  VLDL 23  LDLCALC 478*    Studies/Results: Ct Head Wo Contrast  02/03/2013   CLINICAL DATA:  Sudden nausea and dizziness with blurry vision started about 2 hr ago  EXAM: CT HEAD WITHOUT CONTRAST  TECHNIQUE: Contiguous axial images were obtained from the base of the skull through the vertex without intravenous contrast.  COMPARISON:  06/28/2010  FINDINGS: There are chronic lacunar infarcts in the bilateral basal ganglia. The majority of these were present previously. A few lacunar infarcts are new when compared to the prior study but nonetheless demonstrate a chronic appearance. There is no evidence of vascular territory infarct. There is no hemorrhage or extra-axial fluid.  IMPRESSION: Chronic ischemic change with numerous chronic deep white matter and basal ganglia lacunar infarcts.   Electronically Signed   By: Esperanza Heir M.D.   On: 02/03/2013 18:36   Mr Brain Wo Contrast  02/03/2013   *RADIOLOGY REPORT*  Clinical Data: Dizziness and blurred vision.  MRI HEAD WITHOUT CONTRAST  Technique:  Multiplanar, multiecho pulse sequences of the brain and surrounding structures were obtained according to standard protocol without intravenous contrast.  Comparison: CT of the head February 03, 2013 at 1830 hours.  Findings: No reduced diffusion to suggest acute ischemia.  Coronal T2 is moderately motion degraded.   Remote right basal ganglia infarct with minimal susceptibility artifact which may reflect mineralization. Surrounding T2 hyperintense gliosis.  6 mm remote left basal ganglia lacunar infarct, in addition to remote left thalamic 1 cm infarct.  Patchy to confluence supratentorial white matter greater than expected for age.  3 mm of left to right midline shift, on ex vacuo basis without mass lesions.  No abnormal intracranial intrinsic T1 shortening.  No abnormal extra-axial fluid collections.  Normal major intracranial vascular flow voids seen at the skull base.  No paranasal sinus air-fluid levels.  Ocular globes and orbital contents are not suspicious.  Mild bilateral temporomandibular osteoarthrosis. No suspicious calvarial bone marrow signal.  IMPRESSION: No acute intracranial process.  Remote right basal ganglia hemorrhagic infarct with additional left basal ganglia and left thalamic remote lacunar infarcts.  Moderate to severe white matter changes suggest chronic small vessel ischemic disease.   Original Report Authenticated By: Awilda Metro   Mr Mra Head/brain Wo Cm  02/04/2013   CLINICAL DATA:  57 year old male with blurred vision and slurred speech.  EXAM: MRA HEAD WITHOUT CONTRAST  TECHNIQUE: MRA HEAD WITHOUT CONTRAST  COMPARISON:  Brain MRI 02/03/2013. Intracranial MRA 06/28/2010.  FINDINGS: Stable antegrade flow signal in the posterior circulation with dominant distal left vertebral artery. Diminutive and irregular distal right vertebral artery appear stable. Stable vertebrobasilar junction. Patent basilar artery without stenosis. SCA and PCA origins remain normal. Left PCA branches are stable and within normal limits. Moderate irregularity and stenosis of the right PCA P2 segment is stable. Distal right PCA flow is stable. Posterior communicating arteries are diminutive or absent as before.  Stable antegrade flow in both ICA siphons. No ICA siphon stenosis. Ophthalmic artery origins are within  normal limits. Stable carotid termini. MCA and ACA origins are within normal limits.  Mildly tortuous  proximal ACA is. Anterior communicating artery and visualized ACA branches are within normal limits. Visualized bilateral MCA branches are stable and within normal limits.  IMPRESSION: Stable intracranial MRA since 2012. Negative anterior circulation. Chronically diminutive and irregular distal right vertebral artery. 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   Electronically Signed   By: Augusto Gamble M.D.   On: 02/04/2013 08:08    MEDICATIONS                                                                                                                       I have reviewed the patient's current medications.  ASSESSMENT/PLAN:                                                                                                             57 y/o with recurrent episodes of paroxysmal nausea, lightheadedness, dimming of vision, increased sweatiness lasting for several minutes without associated impairment of consciousness. Non focal neuro-exam and unremarkable EEG, CUS, MRI brain. MRA brain showed no evidence of vertebrobasilar arterial disease.  Patient paroxysmal episodes resemble dysautonomia/presyncope and less likely partial complex seizures. I believe further work up should be completed as outpatient, and this should include a tilt table test as well as ambulatory 48 hour EEG. Will sign off. Please, call neurology with any questions or concerns.       Jermaine Portela, MD [facility] [phone]  02/05/2013, 9:52 AM

## 2013-02-05 NOTE — Discharge Summary (Signed)
Physician Discharge Summary  Saleh Ulbrich ZOX:[id number] DOB: 03-Dec-1955 DOA: 02/03/2013  PCP: No primary provider on file.  Admit date: 02/03/2013 Discharge date: 02/05/2013  Time spent: >35 minutes  Recommendations for Outpatient Follow-up:  F/u with neurologist tin 3-4 weeks  Discharge Diagnoses:  Principal Problem:   TIA (transient ischemic attack) Active Problems:   Renal failure   HTN (hypertension)   HLD (hyperlipidemia)   Discharge Condition: stable   Diet recommendation: heart healthy   Filed Weights   02/03/13 1740 02/03/13 2229  Weight: 72.774 kg (160 lb 7 oz) 75.161 kg (165 lb 11.2 oz)    History of present illness:  57 y.o. male with known history of hypertension and hyperlipidemia and ongoing tobacco abuse with previous history of TIA was brought to the ER after patient has had blurred vision with slurred speech and difficulty walking   Hospital Course:  Patient was seen by neurologist who thought that patient has recurrent episodes of paroxysmal nausea, lightheadedness, with sweatiness likely due to dysautonomia; Non focal neuro-exam and unremarkable EEG, CUS, MRI brain. MRA brain showed no evidence of vertebrobasilar arterial disease.  -recommended to f/u as outpatient for tilt table test as well as ambulatory 48 hour EEG.   AKI resolved on IVF likely mild dehydration which resolved;    Procedures:  CT/MRI  Head  (i.e. Studies not automatically included, echos, thoracentesis, etc; not x-rays)  Consultations:  Neurology   Discharge Exam: Filed Vitals:   02/05/13 0935  BP: 156/91  Pulse: 67  Temp: 98.1 F (36.7 C)  Resp: 18    General: alert Cardiovascular: s1,s2 rrr Respiratory: cta BL   Discharge Instructions  Discharge Orders   Future Orders Complete By Expires   Diet - low sodium heart healthy  As directed    Discharge instructions  As directed    Comments:     Please follow up with neurologist in 3-4 weeks   Increase activity  slowly  As directed        Medication List         amLODipine 5 MG tablet  Commonly known as:  NORVASC  Take 5 mg by mouth daily.     aspirin EC 81 MG tablet  Take 81 mg by mouth daily.     atorvastatin 20 MG tablet  Commonly known as:  LIPITOR  Take 20 mg by mouth daily.     multivitamin with minerals Tabs tablet  Take 1 tablet by mouth daily.       Allergies  Allergen Reactions  . Naproxen Rash       Follow-up Information   Follow up with CAMILO, Georga Hacking, MD In 1 month.   Specialty:  Neurology   Contact information:   [address] [place] [address] [phone]        The results of significant diagnostics from this hospitalization (including imaging, microbiology, ancillary and laboratory) are listed below for reference.    Significant Diagnostic Studies: Ct Head Wo Contrast  02/03/2013   CLINICAL DATA:  Sudden nausea and dizziness with blurry vision started about 2 hr ago  EXAM: CT HEAD WITHOUT CONTRAST  TECHNIQUE: Contiguous axial images were obtained from the base of the skull through the vertex without intravenous contrast.  COMPARISON:  06/28/2010  FINDINGS: There are chronic lacunar infarcts in the bilateral basal ganglia. The majority of these were present previously. A few lacunar infarcts are new when compared to the prior study but nonetheless demonstrate a chronic appearance. There  is no evidence of vascular territory infarct. There is no hemorrhage or extra-axial fluid.  IMPRESSION: Chronic ischemic change with numerous chronic deep white matter and basal ganglia lacunar infarcts.   Electronically Signed   By: Esperanza Heir M.D.   On: 02/03/2013 18:36   Mr Brain Wo Contrast  02/03/2013   *RADIOLOGY REPORT*  Clinical Data: Dizziness and blurred vision.  MRI HEAD WITHOUT CONTRAST  Technique:  Multiplanar, multiecho pulse sequences of the brain and surrounding structures were obtained according to standard protocol without intravenous  contrast.  Comparison: CT of the head February 03, 2013 at 1830 hours.  Findings: No reduced diffusion to suggest acute ischemia.  Coronal T2 is moderately motion degraded.  Remote right basal ganglia infarct with minimal susceptibility artifact which may reflect mineralization. Surrounding T2 hyperintense gliosis.  6 mm remote left basal ganglia lacunar infarct, in addition to remote left thalamic 1 cm infarct.  Patchy to confluence supratentorial white matter greater than expected for age.  3 mm of left to right midline shift, on ex vacuo basis without mass lesions.  No abnormal intracranial intrinsic T1 shortening.  No abnormal extra-axial fluid collections.  Normal major intracranial vascular flow voids seen at the skull base.  No paranasal sinus air-fluid levels.  Ocular globes and orbital contents are not suspicious.  Mild bilateral temporomandibular osteoarthrosis. No suspicious calvarial bone marrow signal.  IMPRESSION: No acute intracranial process.  Remote right basal ganglia hemorrhagic infarct with additional left basal ganglia and left thalamic remote lacunar infarcts.  Moderate to severe white matter changes suggest chronic small vessel ischemic disease.   Original Report Authenticated By: Awilda Metro   Mr Mra Head/brain Wo Cm  02/04/2013   CLINICAL DATA:  57 year old male with blurred vision and slurred speech.  EXAM: MRA HEAD WITHOUT CONTRAST  TECHNIQUE: MRA HEAD WITHOUT CONTRAST  COMPARISON:  Brain MRI 02/03/2013. Intracranial MRA 06/28/2010.  FINDINGS: Stable antegrade flow signal in the posterior circulation with dominant distal left vertebral artery. Diminutive and irregular distal right vertebral artery appear stable. Stable vertebrobasilar junction. Patent basilar artery without stenosis. SCA and PCA origins remain normal. Left PCA branches are stable and within normal limits. Moderate irregularity and stenosis of the right PCA P2 segment is stable. Distal right PCA flow is stable.  Posterior communicating arteries are diminutive or absent as before.  Stable antegrade flow in both ICA siphons. No ICA siphon stenosis. Ophthalmic artery origins are within normal limits. Stable carotid termini. MCA and ACA origins are within normal limits.  Mildly tortuous proximal ACA is. Anterior communicating artery and visualized ACA branches are within normal limits. Visualized bilateral MCA branches are stable and within normal limits.  IMPRESSION: Stable intracranial MRA since 2012. Negative anterior circulation. Chronically diminutive and irregular distal right vertebral artery. 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   Electronically Signed   By: Augusto Gamble M.D.   On: 02/04/2013 08:08    Microbiology: No results found for this or any previous visit (from the past 240 hour(s)).   Labs: Basic Metabolic Panel:  Recent Labs Lab 02/03/13 1746 02/04/13 0615  NA 141 138  K 4.1 3.6  CL 102 103  CO2 23 24  GLUCOSE 108* 89  BUN 19 19  CREATININE 1.74* 1.31  CALCIUM 9.6 9.0   Liver Function Tests:  Recent Labs Lab 02/03/13 1746 02/04/13 0615  AST 23 22  ALT 22 20  ALKPHOS 128* 107  BILITOT 0.4 0.4  PROT 8.3 7.0  ALBUMIN 4.2 3.5   No results found for this basename: LIPASE, AMYLASE,  in the last 168 hours No results found for this basename: AMMONIA,  in the last 168 hours CBC:  Recent Labs Lab 02/03/13 1746 02/04/13 0615  WBC 8.9 7.3  NEUTROABS 6.7 3.9  HGB 17.3* 15.1  HCT 48.6 44.8  MCV 82.8 83.0  PLT 241 246   Cardiac Enzymes: No results found for this basename: CKTOTAL, CKMB, CKMBINDEX, TROPONINI,  in the last 168 hours BNP: BNP (last 3 results) No results found for this basename: PROBNP,  in the last 8760 hours CBG:  Recent Labs Lab 02/04/13 0643 02/04/13 1105 02/04/13 1622 02/04/13 2146 02/05/13 0651  GLUCAP 81 102* 118* 135* 93       Signed:  Haskell Rihn N  [facility] 02/05/2013, 10:32  AM

## 2013-11-20 ENCOUNTER — Encounter (HOSPITAL_COMMUNITY): Payer: Self-pay | Admitting: Emergency Medicine

## 2013-11-20 ENCOUNTER — Emergency Department (HOSPITAL_COMMUNITY)
Admission: EM | Admit: 2013-11-20 | Discharge: 2013-11-20 | Disposition: A | Discharge status: Home / Self Care | Payer: BC Managed Care – PPO | Attending: Emergency Medicine | Admitting: Emergency Medicine

## 2013-11-20 DIAGNOSIS — S00402A Unspecified superficial injury of left ear, initial encounter: Secondary | ICD-10-CM

## 2013-11-20 DIAGNOSIS — X58XXXA Exposure to other specified factors, initial encounter: Secondary | ICD-10-CM | POA: Insufficient documentation

## 2013-11-20 DIAGNOSIS — Y939 Activity, unspecified: Secondary | ICD-10-CM | POA: Insufficient documentation

## 2013-11-20 DIAGNOSIS — S0993XA Unspecified injury of face, initial encounter: Secondary | ICD-10-CM | POA: Insufficient documentation

## 2013-11-20 DIAGNOSIS — Y929 Unspecified place or not applicable: Secondary | ICD-10-CM | POA: Insufficient documentation

## 2013-11-20 DIAGNOSIS — I10 Essential (primary) hypertension: Secondary | ICD-10-CM | POA: Insufficient documentation

## 2013-11-20 DIAGNOSIS — Z8673 Personal history of transient ischemic attack (TIA), and cerebral infarction without residual deficits: Secondary | ICD-10-CM | POA: Insufficient documentation

## 2013-11-20 DIAGNOSIS — Z23 Encounter for immunization: Secondary | ICD-10-CM | POA: Insufficient documentation

## 2013-11-20 DIAGNOSIS — S199XXA Unspecified injury of neck, initial encounter: Secondary | ICD-10-CM

## 2013-11-20 DIAGNOSIS — IMO0002 Reserved for concepts with insufficient information to code with codable children: Secondary | ICD-10-CM | POA: Insufficient documentation

## 2013-11-20 DIAGNOSIS — Z8546 Personal history of malignant neoplasm of prostate: Secondary | ICD-10-CM | POA: Insufficient documentation

## 2013-11-20 DIAGNOSIS — Z79899 Other long term (current) drug therapy: Secondary | ICD-10-CM | POA: Insufficient documentation

## 2013-11-20 DIAGNOSIS — Z7982 Long term (current) use of aspirin: Secondary | ICD-10-CM | POA: Insufficient documentation

## 2013-11-20 MED ORDER — TETANUS-DIPHTH-ACELL PERTUSSIS 5-2.5-18.5 LF-MCG/0.5 IM SUSP
0.5000 mL | Freq: Once | INTRAMUSCULAR | Status: AC
  Administered 2013-11-20: 0.5 mL via INTRAMUSCULAR
  Filled 2013-11-20: qty 0.5

## 2013-11-20 NOTE — ED Notes (Signed)
Pt. Reports superficial  left outer ear laceration this afternoon , pt. unable to give cause of laceration , bleeding controlled at arrival .

## 2013-11-20 NOTE — ED Notes (Signed)
Declined W/C at D/C and was escorted to lobby by RN. 

## 2013-11-20 NOTE — ED Provider Notes (Signed)
CSN: [id number]     Arrival date & time 11/20/13  2012 History   This chart was scribed for non-physician practitioner, Domenic Moras, PA-C, working with Fredia Sorrow, MD, by Delphia Grates, ED Scribe. This patient was seen in room TR06C/TR06C and the patient's care was started at 8:58 PM.    No chief complaint on file.   The history is provided by the patient. No language interpreter was used.    HPI Comments: Jermaine Lowery is a 58 y.o. Male, with history of stroke, HTN, and prostate cancer, who presents to the Emergency Department complaining of bleeding from the left ear that began PTA. Per wife, patient was laying on the couch and when he sat up, she noticed blood on the couch. Patient states he is unsure of how this occurred. Patient has history of stroke (left side). There is associated abrasion to left outer ear that is not actively bleeding. He denies HA or any new numbness. Patient denies any pain at present. Patient denies history of DM. Patient is no UTD on tetanus. Patient has appointment with neurologist on the 30th of this month.   Past Medical History  Diagnosis Date  . Stroke   . Hypertension   . Prostate cancer    Past Surgical History  Procedure Laterality Date  . Rotator cuff repair    . Prostate cacner     Family History  Problem Relation Age of Onset  . CAD Mother   . Diabetes Mellitus II Mother   . Diabetes Mellitus II Brother   . Stroke Paternal Uncle    History  Substance Use Topics  . Smoking status: Current Every Day Smoker  . Smokeless tobacco: Not on file  . Alcohol Use: No    Review of Systems  HENT: Negative for ear pain.   Skin: Positive for wound (small left ear laceration).  Neurological: Negative for numbness and headaches.      Allergies  Naproxen  Home Medications   Prior to Admission medications   Medication Sig Start Date End Date Taking? Authorizing Provider  amLODipine (NORVASC) 5 MG tablet Take 5 mg by mouth daily.     Historical Provider, MD  aspirin EC 81 MG tablet Take 81 mg by mouth daily.    Historical Provider, MD  atorvastatin (LIPITOR) 20 MG tablet Take 20 mg by mouth daily.    Historical Provider, MD  Multiple Vitamin (MULTIVITAMIN WITH MINERALS) TABS tablet Take 1 tablet by mouth daily.    Historical Provider, MD   Triage Vitals: BP 182/100  Pulse 81  Temp(Src) 98.2 F (36.8 C) (Oral)  Resp 18  SpO2 100%  Physical Exam  Nursing note and vitals reviewed. Constitutional: He is oriented to person, place, and time. He appears well-developed and well-nourished. No distress.  HENT:  Head: Normocephalic and atraumatic.  Right Ear: Tympanic membrane, external ear and ear canal normal. No foreign bodies.  Left Ear: Tympanic membrane and ear canal normal. Left ear exhibits lacerations (small, superficial). No foreign bodies.  Left ear there is a small superficial skin tear noted to the upper helix of the earlobe. Not actively bleeding. No foreign object noted. Ear canals and eardrum appear to be normal and intact. No fluid through cut. No scalp tenderness. No facial tenderness. No cervical midline spine tenderness.  Eyes: Conjunctivae and EOM are normal.  Neck: Neck supple. No tracheal deviation present.  Cardiovascular: Normal rate.   Pulmonary/Chest: Effort normal. No respiratory distress.  Musculoskeletal: Normal range of motion.  Neurological: He is alert and oriented to person, place, and time. No cranial nerve deficit or sensory deficit. GCS eye subscore is 4. GCS verbal subscore is 5. GCS motor subscore is 6.  L sided residual deficits from prior stroke, not new  Skin: Skin is warm and dry.  Psychiatric: He has a normal mood and affect. His behavior is normal.    ED Course  Procedures (including critical care time)  DIAGNOSTIC STUDIES: Oxygen Saturation is 100% on room air, normal by my interpretation.    COORDINATION OF CARE: At 2103 superficial skin tear to L ear lobe.  No new focal  neuro deficits to suggest a new stroke.  No headache or any other concerning finding on exam.  Will treat wound, up date tdap. Discussed treatment plan with patient which includes Bacitracin. Patient agrees.   Labs Review Labs Reviewed - No data to display  Imaging Review No results found.   EKG Interpretation None      MDM   Final diagnoses:  Superficial injury of left ear, initial encounter    BP 142/109  Pulse 69  Temp(Src) 98.2 F (36.8 C) (Oral)  Resp 18  SpO2 100%   I personally performed the services described in this documentation, which was scribed in my presence. The recorded information has been reviewed and is accurate.     Domenic Moras, PA-C 11/20/13 2119

## 2013-11-20 NOTE — Discharge Instructions (Signed)
Auricle Injuries °You have an injury to your external ear (auricle). The ear has a layer of skin over cartilage. A cut or bruise to the ear can separate the skin from the cartilage underneath. This can cause problems with healing if blood gathers between the skin and the cartilage. Permanent damage to the ear may result if the excess blood is not drained within 1 to 2 days. °Stitches, tape, or tissue glue may be used to close a cut. A pressure bandage may be used to keep blood from forming under the injured skin. If there is a lot of blood present (hematoma), a needle aspiration may be needed to remove it. You must have the ear checked within 1 to 2 days or as directed if you have had this type of injury. This is see if the blood has accumulated again. Call your caregiver for a follow-up exam as recommended.  °SEEK IMMEDIATE MEDICAL CARE IF: °· You develop severe pain. °· You develop a fever or pus like drainage. °· You have increased hearing loss or other problems. °MAKE SURE YOU:  °· Understand these instructions. °· Will watch your condition. °· Will get help right away if you are not doing well or get worse. °Document Released: 04/17/2005 Document Revised: 07/10/2011 Document Reviewed: 10/04/2006 °Exit[employer]® Patient Information ©2015 [employer]. This information is not intended to replace advice given to you by your health care provider. Make sure you discuss any questions you have with your health care provider. ° °

## 2013-11-21 NOTE — ED Provider Notes (Signed)
Medical screening examination/treatment/procedure(s) were performed by non-physician practitioner and as supervising physician I was immediately available for consultation/collaboration.   EKG Interpretation None        Fredia Sorrow, MD 11/21/13 [phone]

## 2013-11-27 ENCOUNTER — Inpatient Hospital Stay (HOSPITAL_COMMUNITY)
Admission: EM | Admit: 2013-11-27 | Discharge: 2013-12-01 | DRG: 066 | Disposition: A | Discharge status: Home Health | Payer: Non-veteran care | Attending: Internal Medicine | Admitting: Internal Medicine

## 2013-11-27 ENCOUNTER — Encounter (HOSPITAL_COMMUNITY): Payer: Self-pay | Admitting: Emergency Medicine

## 2013-11-27 DIAGNOSIS — I6789 Other cerebrovascular disease: Secondary | ICD-10-CM | POA: Diagnosis present

## 2013-11-27 DIAGNOSIS — I6350 Cerebral infarction due to unspecified occlusion or stenosis of unspecified cerebral artery: Principal | ICD-10-CM | POA: Diagnosis present

## 2013-11-27 DIAGNOSIS — Z8546 Personal history of malignant neoplasm of prostate: Secondary | ICD-10-CM

## 2013-11-27 DIAGNOSIS — E785 Hyperlipidemia, unspecified: Secondary | ICD-10-CM | POA: Diagnosis present

## 2013-11-27 DIAGNOSIS — Z23 Encounter for immunization: Secondary | ICD-10-CM

## 2013-11-27 DIAGNOSIS — R29810 Facial weakness: Secondary | ICD-10-CM | POA: Diagnosis present

## 2013-11-27 DIAGNOSIS — I69998 Other sequelae following unspecified cerebrovascular disease: Secondary | ICD-10-CM

## 2013-11-27 DIAGNOSIS — R29898 Other symptoms and signs involving the musculoskeletal system: Secondary | ICD-10-CM

## 2013-11-27 DIAGNOSIS — I10 Essential (primary) hypertension: Secondary | ICD-10-CM | POA: Diagnosis present

## 2013-11-27 DIAGNOSIS — I6340 Cerebral infarction due to embolism of unspecified cerebral artery: Secondary | ICD-10-CM

## 2013-11-27 DIAGNOSIS — I639 Cerebral infarction, unspecified: Secondary | ICD-10-CM

## 2013-11-27 DIAGNOSIS — F17200 Nicotine dependence, unspecified, uncomplicated: Secondary | ICD-10-CM | POA: Diagnosis present

## 2013-11-27 DIAGNOSIS — R269 Unspecified abnormalities of gait and mobility: Secondary | ICD-10-CM | POA: Diagnosis present

## 2013-11-27 DIAGNOSIS — Z833 Family history of diabetes mellitus: Secondary | ICD-10-CM

## 2013-11-27 DIAGNOSIS — Z8249 Family history of ischemic heart disease and other diseases of the circulatory system: Secondary | ICD-10-CM

## 2013-11-27 DIAGNOSIS — Z823 Family history of stroke: Secondary | ICD-10-CM

## 2013-11-27 DIAGNOSIS — R2990 Unspecified symptoms and signs involving the nervous system: Secondary | ICD-10-CM

## 2013-11-27 LAB — COMPREHENSIVE METABOLIC PANEL
ALK PHOS: 117 U/L (ref 39–117)
ALT: 27 U/L (ref 0–53)
ANION GAP: 16 — AB (ref 5–15)
AST: 25 U/L (ref 0–37)
Albumin: 3.9 g/dL (ref 3.5–5.2)
BUN: 19 mg/dL (ref 6–23)
CALCIUM: 9.5 mg/dL (ref 8.4–10.5)
CHLORIDE: 103 meq/L (ref 96–112)
CO2: 25 mEq/L (ref 19–32)
Creatinine, Ser: 1.25 mg/dL (ref 0.50–1.35)
GFR calc Af Amer: 73 mL/min — ABNORMAL LOW (ref >90)
GFR calc non Af Amer: 63 mL/min — ABNORMAL LOW (ref >90)
GLUCOSE: 135 mg/dL — AB (ref 70–99)
POTASSIUM: 3.9 meq/L (ref 3.7–5.3)
SODIUM: 144 meq/L (ref 137–147)
Total Bilirubin: 0.3 mg/dL (ref 0.3–1.2)
Total Protein: 7.7 g/dL (ref 6.0–8.3)

## 2013-11-27 LAB — CBC
HEMATOCRIT: 46.3 % (ref 39.0–52.0)
Hemoglobin: 15.1 g/dL (ref 13.0–17.0)
MCH: 28.2 pg (ref 26.0–34.0)
MCHC: 32.6 g/dL (ref 30.0–36.0)
MCV: 86.5 fL (ref 78.0–100.0)
PLATELETS: 257 10*3/uL (ref 150–400)
RBC: 5.35 MIL/uL (ref 4.22–5.81)
RDW: 14.9 % (ref 11.5–15.5)
WBC: 5.7 10*3/uL (ref 4.0–10.5)

## 2013-11-27 LAB — PROTIME-INR
INR: 0.96 (ref 0.00–1.49)
PROTHROMBIN TIME: 12.8 s (ref 11.6–15.2)

## 2013-11-27 LAB — DIFFERENTIAL
Basophils Absolute: 0 10*3/uL (ref 0.0–0.1)
Basophils Relative: 1 % (ref 0–1)
Eosinophils Absolute: 0.1 10*3/uL (ref 0.0–0.7)
Eosinophils Relative: 1 % (ref 0–5)
LYMPHS PCT: 34 % (ref 12–46)
Lymphs Abs: 2 10*3/uL (ref 0.7–4.0)
MONO ABS: 0.5 10*3/uL (ref 0.1–1.0)
Monocytes Relative: 9 % (ref 3–12)
NEUTROS PCT: 55 % (ref 43–77)
Neutro Abs: 3.2 10*3/uL (ref 1.7–7.7)

## 2013-11-27 LAB — I-STAT TROPONIN, ED: Troponin i, poc: 0 ng/mL (ref 0.00–0.08)

## 2013-11-27 LAB — APTT: aPTT: 27 seconds (ref 24–37)

## 2013-11-27 LAB — CBG MONITORING, ED: GLUCOSE-CAPILLARY: 108 mg/dL — AB (ref 70–99)

## 2013-11-27 MED ORDER — ASPIRIN EC 81 MG PO TBEC
81.0000 mg | DELAYED_RELEASE_TABLET | Freq: Every day | ORAL | Status: DC
  Administered 2013-11-28 – 2013-11-29 (×2): 81 mg via ORAL
  Filled 2013-11-27 (×2): qty 1

## 2013-11-27 NOTE — ED Notes (Signed)
Pt had a stroke a month ago and he had a MRI done today at the [place] that showed an acute stroke and told to come here.  Pt has had weakness to left arm for one week, left facial droop unknown when that started

## 2013-11-27 NOTE — ED Notes (Signed)
CBG 108. 

## 2013-11-27 NOTE — ED Provider Notes (Signed)
CSN: [id number]     Arrival date & time 11/27/13  1745 History   First MD Initiated Contact with Patient 11/27/13 2148     Chief Complaint  Patient presents with  . Stroke Symptoms     (Consider location/radiation/quality/duration/timing/severity/associated sxs/prior Treatment) Patient is a 58 y.o. male presenting with general illness. The history is provided by the patient.  Illness Severity:  Mild Onset quality:  Sudden Duration:  2 months Timing:  Constant Progression:  Unchanged Chronicity:  Chronic Associated symptoms: no abdominal pain, no chest pain, no congestion, no diarrhea, no fever, no headaches, no myalgias, no rash, no shortness of breath and no vomiting    58 yo M with a significant past medical history of multiple TIAs and a stroke leaving him weak on the left upper extremity. Patient was seen today at the [facility] for a routine MRI to evaluate for damage from prior strokes and TIAs. Patient was seen this morning was eventually contacted a number of hours later told that he had an acute stroke and told to come immediately to the emergency department.  Patient lives in [place] one so came here for further evaluation. Patient denies any headache denies any head injuries patient denies any anticoagulant use. Patient denies any new symptoms.     Past Medical History  Diagnosis Date  . Stroke   . Hypertension   . Prostate cancer    Past Surgical History  Procedure Laterality Date  . Rotator cuff repair    . Prostate cacner     Family History  Problem Relation Age of Onset  . CAD Mother   . Diabetes Mellitus II Mother   . Diabetes Mellitus II Brother   . Stroke Paternal Uncle    History  Substance Use Topics  . Smoking status: Current Every Day Smoker  . Smokeless tobacco: Not on file  . Alcohol Use: No    Review of Systems  Constitutional: Negative for fever and chills.  HENT: Negative for congestion and facial swelling.   Eyes: Negative for discharge and  visual disturbance.  Respiratory: Negative for shortness of breath.   Cardiovascular: Negative for chest pain and palpitations.  Gastrointestinal: Negative for vomiting, abdominal pain and diarrhea.  Musculoskeletal: Negative for arthralgias and myalgias.  Skin: Negative for color change and rash.  Neurological: Positive for weakness. Negative for tremors, syncope and headaches.  Psychiatric/Behavioral: Negative for confusion and dysphoric mood.      Allergies  Naproxen  Home Medications   Prior to Admission medications   Medication Sig Start Date End Date Taking? Authorizing Provider  amLODipine (NORVASC) 10 MG tablet Take 10 mg by mouth daily.   Yes Historical Provider, MD  aspirin EC 81 MG tablet Take 81 mg by mouth daily.   Yes Historical Provider, MD  pravastatin (PRAVACHOL) 80 MG tablet Take 40 mg by mouth daily.   Yes Historical Provider, MD  sertraline (ZOLOFT) 100 MG tablet Take 50 mg by mouth daily.   Yes Historical Provider, MD   BP 170/101  Pulse 73  Temp(Src) 98.5 F (36.9 C) (Oral)  Resp 14  SpO2 95% Physical Exam  Constitutional: He is oriented to person, place, and time. He appears well-developed and well-nourished.  HENT:  Head: Normocephalic and atraumatic.  Eyes: EOM are normal. Pupils are equal, round, and reactive to light.  Neck: Normal range of motion. Neck supple. No JVD present.  Cardiovascular: Normal rate and regular rhythm.  Exam reveals no gallop and no friction rub.  No murmur heard. Pulmonary/Chest: No respiratory distress. He has no wheezes.  Abdominal: He exhibits no distension. There is no rebound and no guarding.  Musculoskeletal: Normal range of motion.  Neurological: He is alert and oriented to person, place, and time. No cranial nerve deficit or sensory deficit. He displays a negative Romberg sign. Coordination normal. GCS eye subscore is 4. GCS verbal subscore is 5. GCS motor subscore is 6. He displays no Babinski's sign on the right  side. He displays no Babinski's sign on the left side.  Reflex Scores:      Tricep reflexes are 2+ on the right side and 2+ on the left side.      Bicep reflexes are 2+ on the right side and 2+ on the left side.      Brachioradialis reflexes are 2+ on the right side and 2+ on the left side.      Patellar reflexes are 2+ on the right side and 2+ on the left side.      Achilles reflexes are 2+ on the right side and 2+ on the left side. 4/5 LUE strength  Skin: No rash noted. No pallor.  Psychiatric: He has a normal mood and affect. His behavior is normal.    ED Course  Procedures (including critical care time) Labs Review Labs Reviewed  COMPREHENSIVE METABOLIC PANEL - Abnormal; Notable for the following:    Glucose, Bld 135 (*)    GFR calc non Af Amer 63 (*)    GFR calc Af Amer 73 (*)    Anion gap 16 (*)    All other components within normal limits  CBG MONITORING, ED - Abnormal; Notable for the following:    Glucose-Capillary 108 (*)    All other components within normal limits  PROTIME-INR  APTT  CBC  DIFFERENTIAL  HEMOGLOBIN A1C  LIPID PANEL  I-STAT TROPOININ, ED    Imaging Review No results found.   EKG Interpretation   Date/Time:  Thursday November 27 2013 18:05:32 EDT Ventricular Rate:  102 PR Interval:  150 QRS Duration: 82 QT Interval:  332 QTC Calculation: 432 R Axis:   74 Text Interpretation:  Sinus tachycardia Otherwise normal ECG Since last  tracing rate faster Confirmed by YAO  MD, DAVID ([id number]) on 11/27/2013  9:44:05 PM      MDM   Final diagnoses:  Stroke-like symptoms    Patient is a 58 y.o. male who presents with abnormal MRI findings.  No noted new symptoms. Neuro exam apparently unchanged from prior notes. Neuro consult.    Thyroid by neurology, feel the patient needs to be admitted to the hospital for further stroke workup. Hospital is consulted for admission.  Deno Etienne, MD 11/27/13 [phone]

## 2013-11-27 NOTE — ED Notes (Signed)
Patients wife approached nurses station concerned with her husbands care. Explained to her the process and reason for the delay. Referred her to talk to RN Shanon Brow at triage and he also explained the process to her.

## 2013-11-27 NOTE — Consult Note (Signed)
Referring Physician: ED    Chief Complaint: left arm weakness, question of outside MRI with new stroke  HPI:                                                                                                                                         Jermaine Lowery is an 58 y.o. 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 Patient is not very talkative and answers " yes" or " no" questions, but according to his family he has been having progressive weakness of the left arm since last April, to the point that he can not use his left hand properly and can not get dressed by himself. He denies weakness of the left leg but family stated that he seem to drag the left leg. No reported changes in speech, HA, vertigo, double vision, difficulty swallowing, or visual disturbances. Family tells me that last June he had his initial neurological evaluation at [facility] and MRI brain was requested back then and completed today. Patient went home and got a call from his neurologist saying that he had an acute stroke on that MRI and was asked to present to the nearest ER. Unfortunately, that outside MRI is not available for review at this moment. Date last known well: uncertain  Time last known well: uncertain tPA Given: no, out of the window.   Past Medical History  Diagnosis Date  . Stroke   . Hypertension   . Prostate cancer     Past Surgical History  Procedure Laterality Date  . Rotator cuff repair    . Prostate cacner      Family History  Problem Relation Age of Onset  . CAD Mother   . Diabetes Mellitus II Mother   . Diabetes Mellitus II Brother   . Stroke Paternal Uncle    Social History:  reports that he has been smoking.  He does not have any smokeless tobacco  history on file. He reports that he does not drink alcohol or use illicit drugs.  Allergies:  Allergies  Allergen Reactions  . Naproxen Rash    Medications:  I have reviewed the patient's current medications.  ROS:                                                                                                                                       History obtained from family and chart review  General ROS: negative for - chills, fatigue, fever, night sweats, weight gain or weight loss Psychological ROS: negative for - behavioral disorder, hallucinations, memory difficulties, mood swings or suicidal ideation Ophthalmic ROS: negative for - blurry vision, double vision, eye pain or loss of vision ENT ROS: negative for - epistaxis, nasal discharge, oral lesions, sore throat, tinnitus or vertigo Allergy and Immunology ROS: negative for - hives or itchy/watery eyes Hematological and Lymphatic ROS: negative for - bleeding problems, bruising or swollen lymph nodes Endocrine ROS: negative for - galactorrhea, hair pattern changes, polydipsia/polyuria or temperature intolerance Respiratory ROS: negative for - cough, hemoptysis, shortness of breath or wheezing Cardiovascular ROS: negative for - chest pain, dyspnea on exertion, edema or irregular heartbeat Gastrointestinal ROS: negative for - abdominal pain, diarrhea, hematemesis, nausea/vomiting or stool incontinence Genito-Urinary ROS: negative for - dysuria, hematuria, incontinence or urinary frequency/urgency Musculoskeletal ROS: negative for - joint swelling Neurological ROS: as noted in HPI Dermatological ROS: negative for rash and skin lesion changes  Physical exam: pleasant male in no apparent distress. Blood pressure 170/101, pulse 73, temperature 98.5 F (36.9 C), temperature source Oral, resp. rate 14, SpO2  95.00%. Head: normocephalic. Neck: supple, no bruits, no JVD. Cardiac: no murmurs. Lungs: clear. Abdomen: soft, no tender, no mass. Extremities: no edema. Neurologic Examination:                                                                                                      Mental Status: Alert, oriented. Comprehension, naming,and repetition intact.  Speech fluent without evidence of aphasia.  Able to follow 3 step commands without difficulty. Cranial Nerves: II: Discs flat bilaterally; Visual fields grossly normal, pupils equal, round, reactive to light and accommodation III,IV, VI: ptosis not present, extra-ocular motions intact bilaterally V,VII: smile asymmetric due to mild left face weakness, facial light touch sensation normal bilaterally VIII: hearing normal bilaterally IX,X: gag reflex present XI: bilateral shoulder shrug XII: midline tongue extension without atrophy or fasciculations Motor: Significant for mild left arm weakness Tone and bulk:normal tone throughout; no atrophy noted Sensory: Pinprick and light touch intact throughout, bilaterally Deep Tendon Reflexes:  1+ upper ext and 2 at the knee jerks. Plantars: Right: downgoing  Left: downgoing Cerebellar: normal finger-to-nose,  normal heel-to-shin test Gait:  No tested.   Results for orders placed during the hospital encounter of 11/27/13 (from the past 48 hour(s))  PROTIME-INR     Status: None   Collection Time    11/27/13  6:02 PM      Result Value Ref Range   Prothrombin Time 12.8  11.6 - 15.2 seconds   INR 0.96  0.00 - 1.49  APTT     Status: None   Collection Time    11/27/13  6:02 PM      Result Value Ref Range   aPTT 27  24 - 37 seconds  CBC     Status: None   Collection Time    11/27/13  6:02 PM      Result Value Ref Range   WBC 5.7  4.0 - 10.5 K/uL   RBC 5.35  4.22 - 5.81 MIL/uL   Hemoglobin 15.1  13.0 - 17.0 g/dL   HCT 46.3  39.0 - 52.0 %   MCV 86.5  78.0 - 100.0 fL   MCH 28.2  26.0 -  34.0 pg   MCHC 32.6  30.0 - 36.0 g/dL   RDW 14.9  11.5 - 15.5 %   Platelets 257  150 - 400 K/uL  DIFFERENTIAL     Status: None   Collection Time    11/27/13  6:02 PM      Result Value Ref Range   Neutrophils Relative % 55  43 - 77 %   Neutro Abs 3.2  1.7 - 7.7 K/uL   Lymphocytes Relative 34  12 - 46 %   Lymphs Abs 2.0  0.7 - 4.0 K/uL   Monocytes Relative 9  3 - 12 %   Monocytes Absolute 0.5  0.1 - 1.0 K/uL   Eosinophils Relative 1  0 - 5 %   Eosinophils Absolute 0.1  0.0 - 0.7 K/uL   Basophils Relative 1  0 - 1 %   Basophils Absolute 0.0  0.0 - 0.1 K/uL  COMPREHENSIVE METABOLIC PANEL     Status: Abnormal   Collection Time    11/27/13  6:02 PM      Result Value Ref Range   Sodium 144  137 - 147 mEq/L   Potassium 3.9  3.7 - 5.3 mEq/L   Chloride 103  96 - 112 mEq/L   CO2 25  19 - 32 mEq/L   Glucose, Bld 135 (*) 70 - 99 mg/dL   BUN 19  6 - 23 mg/dL   Creatinine, Ser 1.25  0.50 - 1.35 mg/dL   Calcium 9.5  8.4 - 10.5 mg/dL   Total Protein 7.7  6.0 - 8.3 g/dL   Albumin 3.9  3.5 - 5.2 g/dL   AST 25  0 - 37 U/L   ALT 27  0 - 53 U/L   Alkaline Phosphatase 117  39 - 117 U/L   Total Bilirubin 0.3  0.3 - 1.2 mg/dL   GFR calc non Af Amer 63 (*) >90 mL/min   GFR calc Af Amer 73 (*) >90 mL/min   Comment: (NOTE)     The eGFR has been calculated using the CKD EPI equation.     This calculation has not been validated in all clinical situations.     eGFR's persistently <90 mL/min signify possible Chronic Kidney     Disease.   Anion gap 16 (*) 5 - 15  I-STAT TROPOININ, ED  Status: None   Collection Time    11/27/13  6:25 PM      Result Value Ref Range   Troponin i, poc 0.00  0.00 - 0.08 ng/mL   Comment 3            Comment: Due to the release kinetics of cTnI,     a negative result within the first hours     of the onset of symptoms does not rule out     myocardial infarction with certainty.     If myocardial infarction is still suspected,     repeat the test at appropriate  intervals.  CBG MONITORING, ED     Status: Abnormal   Collection Time    11/27/13  9:13 PM      Result Value Ref Range   Glucose-Capillary 108 (*) 70 - 99 mg/dL   No results found.   Assessment: 58 y.o. male with HTN, previous strokes x 2, and chronic weakness of the left arm. Outside MRI brain done today reportedly showed acute stroke but unfortunately the study is not available for further review at this time. Admit to medicine. Will try to locate outside MRI if feasible. Otherwise, full complete stroke work up to be completed here. Continue aspirin for now pending review MRI and completion stroke work up. Stroke team will resume care tomorrow.  Plan: 1. HgbA1c, fasting lipid panel 2. MRI, MRA  of the brain without contrast 3. Echocardiogram 4. Carotid dopplers 5. Prophylactic therapy-aspirin 6. Risk factor modification 7. Telemetry monitoring 8. Frequent neuro checks 9. PT/OT SLP  Dorian Pod ,MD [facility] [phone]  11/27/2013, 11:00 PM

## 2013-11-27 NOTE — ED Provider Notes (Signed)
I saw and evaluated the patient, reviewed the resident's note and I agree with the findings and plan.   EKG Interpretation   Date/Time:  Thursday November 27 2013 18:05:32 EDT Ventricular Rate:  102 PR Interval:  150 QRS Duration: 82 QT Interval:  332 QTC Calculation: 432 R Axis:   74 Text Interpretation:  Sinus tachycardia Otherwise normal ECG Since last  tracing rate faster Confirmed by YAO  MD, DAVID ([id number]) on 11/27/2013  9:44:05 PM      Jermaine Lowery is a 58 y.o. male hx of stroke, HTN here with possible stroke. He had stroke with L arm weakness several months ago at the [place]. He has a routine MRI today at [place] and showed acute R precentral gyrus motor cortex ischemia. He was called to come to the ED. He denies new weakness. On exam, dec hand grasp on L hand, otherwise nl motor. Neuro saw patient, recommend inpatient workup. Will admit to medicine.    Wandra Arthurs, MD 11/27/13 [phone]

## 2013-11-28 ENCOUNTER — Observation Stay (HOSPITAL_COMMUNITY): Payer: Non-veteran care

## 2013-11-28 ENCOUNTER — Emergency Department (HOSPITAL_COMMUNITY): Payer: Non-veteran care

## 2013-11-28 DIAGNOSIS — G459 Transient cerebral ischemic attack, unspecified: Secondary | ICD-10-CM

## 2013-11-28 DIAGNOSIS — I639 Cerebral infarction, unspecified: Secondary | ICD-10-CM | POA: Diagnosis present

## 2013-11-28 DIAGNOSIS — R29818 Other symptoms and signs involving the nervous system: Secondary | ICD-10-CM

## 2013-11-28 LAB — CBC
HCT: 44.3 % (ref 39.0–52.0)
Hemoglobin: 14.7 g/dL (ref 13.0–17.0)
MCH: 28.2 pg (ref 26.0–34.0)
MCHC: 33.2 g/dL (ref 30.0–36.0)
MCV: 84.9 fL (ref 78.0–100.0)
PLATELETS: 249 10*3/uL (ref 150–400)
RBC: 5.22 MIL/uL (ref 4.22–5.81)
RDW: 14.7 % (ref 11.5–15.5)
WBC: 6.2 10*3/uL (ref 4.0–10.5)

## 2013-11-28 LAB — CREATININE, SERUM
Creatinine, Ser: 1.18 mg/dL (ref 0.50–1.35)
GFR calc Af Amer: 78 mL/min — ABNORMAL LOW (ref >90)
GFR calc non Af Amer: 67 mL/min — ABNORMAL LOW (ref >90)

## 2013-11-28 LAB — HEMOGLOBIN A1C
HEMOGLOBIN A1C: 6 % — AB (ref <5.7)
Mean Plasma Glucose: 126 mg/dL — ABNORMAL HIGH (ref <117)

## 2013-11-28 LAB — LIPID PANEL
Cholesterol: 160 mg/dL (ref 0–200)
HDL: 61 mg/dL (ref >39)
LDL CALC: 81 mg/dL (ref 0–99)
Total CHOL/HDL Ratio: 2.6 RATIO
Triglycerides: 91 mg/dL (ref <150)
VLDL: 18 mg/dL (ref 0–40)

## 2013-11-28 MED ORDER — PRAVASTATIN SODIUM 40 MG PO TABS
40.0000 mg | ORAL_TABLET | Freq: Every day | ORAL | Status: DC
  Administered 2013-11-29 – 2013-11-30 (×2): 40 mg via ORAL
  Filled 2013-11-28 (×4): qty 1

## 2013-11-28 MED ORDER — ASPIRIN EC 325 MG PO TBEC: 325.0000 mg | DELAYED_RELEASE_TABLET | Freq: Every day | ORAL | Status: DC

## 2013-11-28 MED ORDER — SERTRALINE HCL 50 MG PO TABS
50.0000 mg | ORAL_TABLET | Freq: Every day | ORAL | Status: DC
  Administered 2013-11-28 – 2013-12-01 (×4): 50 mg via ORAL
  Filled 2013-11-28 (×4): qty 1

## 2013-11-28 MED ORDER — ENOXAPARIN SODIUM 30 MG/0.3ML ~~LOC~~ SOLN
30.0000 mg | SUBCUTANEOUS | Status: DC
  Administered 2013-11-28 – 2013-12-01 (×4): 30 mg via SUBCUTANEOUS
  Filled 2013-11-28 (×4): qty 0.3

## 2013-11-28 MED ORDER — SIMVASTATIN 40 MG PO TABS: 40.0000 mg | ORAL_TABLET | Freq: Every day | ORAL | Status: DC

## 2013-11-28 MED ORDER — AMLODIPINE BESYLATE 10 MG PO TABS
10.0000 mg | ORAL_TABLET | Freq: Every day | ORAL | Status: DC
  Administered 2013-11-28 – 2013-12-01 (×4): 10 mg via ORAL
  Filled 2013-11-28 (×4): qty 1

## 2013-11-28 MED ORDER — STROKE: EARLY STAGES OF RECOVERY BOOK
Freq: Once | Status: AC
  Administered 2013-11-28: 08:00:00
  Filled 2013-11-28: qty 1

## 2013-11-28 MED ORDER — SENNOSIDES-DOCUSATE SODIUM 8.6-50 MG PO TABS: 1.0000 | ORAL_TABLET | Freq: Every evening | ORAL | Status: DC | PRN

## 2013-11-28 NOTE — Progress Notes (Signed)
Pt transferred to unit via NT x 1. Pt connected to telemetry and central monitoring notified. No signs or symptoms of acute distress. No complaints of pain or discomfort. Pt resting in low bed with call light in reach. Will continue to monitor. Fortino Sic, RN, BSN 11/28/2013 1:09 AM

## 2013-11-28 NOTE — Progress Notes (Signed)
Stroke Team Progress Note  HISTORY Jermaine Lowery is a 58 y.o. 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 Patient was not very talkative and answered " yes" or " no" questions, but according to his family he had been having progressive weakness of the left arm since last April, to the point that he could not use his left hand properly and could not get dressed by himself. He denied weakness of the left leg but family stated that he seemed to drag the left leg.  No reported changes in speech, HA, vertigo, double vision, difficulty swallowing, or visual disturbances.  Family reported that last June he had his initial neurological evaluation at [facility] and MRI brain was requested back then and completed on the day of this admission. Patient went home and got a call from his neurologist saying that he had an acute stroke on that MRI and was asked to present to the nearest ER. Unfortunately, that outside MRI was not available for review.  Date last known well: uncertain  Time last known well: uncertain  tPA Given: no, out of the window.    SUBJECTIVE No family members present. The patient denied any new deficits. He is without complaints.  OBJECTIVE Most recent Vital Signs: Filed Vitals:   11/28/13 0100 11/28/13 0300 11/28/13 0500 11/28/13 0700  BP: 145/93 159/94 152/96 157/104  Pulse: 70 70 74 72  Temp: 97.7 F (36.5 C) 98.4 F (36.9 C) 97.9 F (36.6 C) 97.5 F (36.4 C)  TempSrc: Oral Oral Oral Oral  Resp:  16 16 14   Height: 5' 9.5" (1.765 m)     Weight: 155 lb 3.2 oz (70.398 kg)     SpO2: 100% 100% 100% 99%   CBG (last 3)   Recent Labs  11/27/13 2113  GLUCAP 108*    IV Fluid Intake:     MEDICATIONS  .  stroke: mapping our  early stages of recovery book   Does not apply Once  . amLODipine  10 mg Oral Daily  . aspirin EC  81 mg Oral Daily  . enoxaparin (LOVENOX) injection  30 mg Subcutaneous Q24H  . sertraline  50 mg Oral Daily  . simvastatin  40 mg Oral q1800   PRN:  senna-docusate  Diet:  Cardiac  liquids Activity:  Bedrest- change to up with assistance DVT Prophylaxis:  Lovenox  CLINICALLY SIGNIFICANT STUDIES Basic Metabolic Panel:  Recent Labs Lab 11/27/13 1802 11/28/13 0219  NA 144  --   K 3.9  --   CL 103  --   CO2 25  --   GLUCOSE 135*  --   BUN 19  --   CREATININE 1.25 1.18  CALCIUM 9.5  --    Liver Function Tests:  Recent Labs Lab 11/27/13 1802  AST 25  ALT 27  ALKPHOS 117  BILITOT 0.3  PROT 7.7  ALBUMIN 3.9   CBC:  Recent Labs Lab 11/27/13 1802 11/28/13 0219  WBC 5.7 6.2  NEUTROABS 3.2  --   HGB 15.1 14.7  HCT 46.3 44.3  MCV 86.5 84.9  PLT 257 249   Coagulation:  Recent Labs Lab 11/27/13 1802  LABPROT 12.8  INR 0.96   Cardiac Enzymes: No results found for this basename: CKTOTAL, CKMB, CKMBINDEX, TROPONINI,  in  the last 168 hours Urinalysis: No results found for this basename: COLORURINE, APPERANCEUR, LABSPEC, [place], GLUCOSEU, HGBUR, BILIRUBINUR, KETONESUR, PROTEINUR, UROBILINOGEN, NITRITE, LEUKOCYTESUR,  in the last 168 hours Lipid Panel    Component Value Date/Time   CHOL 160 11/28/2013 0126   TRIG 91 11/28/2013 0126   HDL 61 11/28/2013 0126   CHOLHDL 2.6 11/28/2013 0126   VLDL 18 11/28/2013 0126   LDLCALC 81 11/28/2013 0126   HgbA1C  Lab Results  Component Value Date   HGBA1C 5.9* 02/04/2013    Urine Drug Screen:     Component Value Date/Time   LABOPIA NONE DETECTED 02/04/2013 1556   COCAINSCRNUR NONE DETECTED 02/04/2013 1556   LABBENZ NONE DETECTED 02/04/2013 1556   AMPHETMU NONE DETECTED 02/04/2013 1556   THCU NONE DETECTED 02/04/2013 1556   LABBARB NONE DETECTED 02/04/2013 1556    Alcohol Level: No results found for this basename: ETH,  in the last  168 hours  Dg Chest 2 View 11/28/2013    There is no evidence of aspiration or other acute cardiopulmonary disease.    MRI Head Wo Contrast 11/28/2013    1. 9 mm cortically based focus of restricted diffusion within the right frontal lobe, suspicious for acute ischemic infarct.  2. Grossly stable appearance of remote bilateral basal ganglia and left thalamic infarcts. Additional lacunar infarcts within the right thalamus and posterior right centrum semi ovale are new as compared to prior MRI from 02/03/2013, but chronic in appearance on today's study.  3. Global atrophy with moderate to severe white matter disease, grossly stable.    MRA HEAD  11/28/2013 1. Limited study due to motion. No proximal branch occlusion or other acute abnormality within the intracranial circulation.  2. Grossly stable multi focal atherosclerotic irregularity involving the middle cerebral and posterior cerebral artery territories.      Carotid Doppler  pending  2D Echocardiogram  pending  CXR    EKG - sinus tachycardia rate 102 beats per minute. For complete results please see formal report.   Therapy Recommendations - pending  Physical Exam   NEUROLOGIC:   MENTAL STATUS: awake, alert, oriented, language fluent,  follows simple commands.  CRANIAL NERVES: pupils equal and reactive to light,extraocular muscles intact, facial sensation and intact and symmetric, uvula midlinec, tongue midline. Mild left facial droop. Fundi not visualized. Vision acuity is normal visual fields are full to confrontational testing. Joaw jerk is brisk MOTOR: normal bulk and tone, Strength - intact except for left hand significant weakness and difficulty with fine motor movements. Deep tendon reflexes brisk bilaterally. SENSORY: normal and symmetric to light touch  COORDINATION: finger-nose-finger normal - heel to shin normal  REFLEXES: deep tendon reflexes present and symmetric - no babinski    ASSESSMENT Mr. Jermaine Lowery  is a 58 y.o. male presenting with an abnormal MRI performed at the [facility]. T-PA therapy was not initiated secondary to late presentation. MRI - right frontal lobe, suspicious for acute ischemic infarct. Infarct felt to be silent and secondary to small vessel disease. On aspirin 81 mg orally every day prior to admission. Now on aspirin 81 mg orally every day for secondary stroke prevention. Patient with resultant left hand weakness from previous stroke. Stroke work up underway.   Hyperlipidemia  - Cholesterol 160; LDL 81 - Zocor prior to admission  Hypertension  Hemoglobin A1c 5.9  Ongoing tobacco use  Previous TIA/stroke   Hospital day # 1  TREATMENT/PLAN  Change aspirin to clopidogrel 75 mg orally every day for  secondary stroke prevention.  Await Echo, dopplers, and therapy evaluations.  Counseled patient to quit smoking.  Mikey Bussing PA-C [facility] Pager [phone] 11/28/2013, 7:54 AM I have personally examined this patient, reviewed notes, independently viewed imaging studies, participated in medical decision making and plan of care. I have made any additions or clarifications directly to the above note. Agree with note above. He has had previous strokes and is at risk for getting worse and getting even more disabling strokes. He has a 10-15% risk of having another stroke/TIA within the next one year with most of the risk being up front. I counseled him to be compliant with his medications and maintain aggressive risk factor modification and lifestyle change. Antony Contras, MD Medical Director [place] Pager: [phone] 11/28/2013 11:25 AM    SIGNED    To contact Stroke Continuity provider, please refer to [email or web address] After hours, contact General Neurology

## 2013-11-28 NOTE — Progress Notes (Signed)
Patient seen earlier today by my colleague Dr. Ernestina Patches. Patient seen and examined, data base reviewed. Presented with right frontal lobe 1.61mm non-hemorrhagic infarct. Await the rest of the stroke workup and stroke M.D. recommendation.  Jermaine Lowery Pager: [phone] 11/28/2013, 10:55 AM

## 2013-11-28 NOTE — Progress Notes (Signed)
Utilization Review Completed.Jermaine Lowery T7/31/2015  

## 2013-11-28 NOTE — Progress Notes (Signed)
PT Cancellation Note  Patient Details Name: Mivaan Corbitt MRN: [id number] DOB: January 18, 1956   Cancelled Treatment:    Reason Eval/Treat Not Completed: Other (comment) (Order for PT to begin 11/29/13. )  Will return tomorrow for PT evaluation.   Despina Pole 11/28/2013, 3:01 PM Carita Pian. Sanjuana Kava, [place] Pager [phone]

## 2013-11-28 NOTE — H&P (Signed)
Hospitalist Admission History and Physical  Patient name: Jermaine Lowery Medical record number: [id number] Date of birth: November 13, 1955 Age: 58 y.o. Gender: male  Primary Care Provider: [name], MD  Chief Complaint: CVA  History of Present Illness:This is a 58 y.o. year old male with significant past medical history of CVA, HTN, HLD  presenting with CVA. Pt was seen for routine visit at [facility] in [place]. Pt states that during this visit, an MRI was obtained. Pt believes this was done to evaluate old strokes. Per report, pt was noted to have new stroke in R precental gyrus cortex. Pt was directed to come to ER for further evaluation. Pt denies any complaints including hemiparesis, confusion, weakness. Has baseline mild gait instability and LUE weakness. This has been stable.  On presentation to the ER, hemodynamically stable. Afebrile. Bloodwork WNL. Neuro consulted with recs for stroke w/u including repeat MRI. Was on baby ASA at time of incident   Assessment and Plan: Jermaine Lowery is a 58 y.o. year old male presenting with CVA   Active Problems:   Stroke   CVA (cerebral infarction)   1-CVA -Stroke workup including MRI/MRA, 2D ECHO, carotid dopplers, risk stratification labs  -full dose ASA  -f/u neuro recs   2-HTN -continue home regimen  3-HLD -statin   4-tobacco abuse -smoking cessation consult    FEN/GI: NPO pending bedside swallow eval  Prophylaxis: lovenox Disposition: pending further evaluation  Code Status:Full Code    Patient Active Problem List   Diagnosis Date Noted  . Stroke 11/28/2013  . CVA (cerebral infarction) 11/28/2013  . TIA (transient ischemic attack) 02/03/2013  . Renal failure 02/03/2013  . HTN (hypertension) 02/03/2013  . HLD (hyperlipidemia) 02/03/2013   Past Medical History: Past Medical History  Diagnosis Date  . Stroke   . Hypertension   . Prostate cancer     Past Surgical History: Past Surgical History  Procedure  Laterality Date  . Rotator cuff repair    . Prostate cacner      Social History: History   Social History  . Marital Status: Single    Spouse Name: N/A    Number of Children: N/A  . Years of Education: N/A   Social History Main Topics  . Smoking status: Current Every Day Smoker  . Smokeless tobacco: None  . Alcohol Use: No  . Drug Use: No  . Sexual Activity: None   Other Topics Concern  . None   Social History Narrative  . None    Family History: Family History  Problem Relation Age of Onset  . CAD Mother   . Diabetes Mellitus II Mother   . Diabetes Mellitus II Brother   . Stroke Paternal Uncle     Allergies: Allergies  Allergen Reactions  . Naproxen Rash    Current Facility-Administered Medications  Medication Dose Route Frequency Provider Last Rate Last Dose  .  stroke: mapping our early stages of recovery book   Does not apply Once Shanda Howells, MD      . amLODipine (NORVASC) tablet 10 mg  10 mg Oral Daily Shanda Howells, MD      . aspirin EC tablet 325 mg  325 mg Oral Daily Shanda Howells, MD      . aspirin EC tablet 81 mg  81 mg Oral Daily Dorian Pod, MD      . enoxaparin (LOVENOX) injection 30 mg  30 mg Subcutaneous Q24H Shanda Howells, MD      . senna-docusate (Senokot-S) tablet 1 tablet  1 tablet Oral QHS PRN Shanda Howells, MD      . sertraline (ZOLOFT) tablet 50 mg  50 mg Oral Daily Shanda Howells, MD      . simvastatin (ZOCOR) tablet 40 mg  40 mg Oral q1800 Shanda Howells, MD       Review Of Systems: 12 point ROS negative except as noted above in HPI.  Physical Exam: Filed Vitals:   11/28/13 0100  BP: 145/93  Pulse: 70  Temp: 97.7 F (36.5 C)  Resp:     General: alert and cooperative HEENT: PERRLA and extra ocular movement intact Heart: S1, S2 normal, no murmur, rub or gallop, regular rate and rhythm Lungs: clear to auscultation, no wheezes or rales and unlabored breathing Abdomen: abdomen is soft without significant tenderness, masses,  organomegaly or guarding Extremities: extremities normal, atraumatic, no cyanosis or edema Skin:no rashes, no ecchymoses Neurology: LUE weakness, mild slurred speech, otherwise normal exam   Labs and Imaging: Lab Results  Component Value Date/Time   NA 144 11/27/2013  6:02 PM   K 3.9 11/27/2013  6:02 PM   CL 103 11/27/2013  6:02 PM   CO2 25 11/27/2013  6:02 PM   BUN 19 11/27/2013  6:02 PM   CREATININE 1.25 11/27/2013  6:02 PM   GLUCOSE 135* 11/27/2013  6:02 PM   Lab Results  Component Value Date   WBC 5.7 11/27/2013   HGB 15.1 11/27/2013   HCT 46.3 11/27/2013   MCV 86.5 11/27/2013   PLT 257 11/27/2013    No results found.         Shanda Howells MD  Pager: [phone]

## 2013-11-28 NOTE — Progress Notes (Signed)
  Echocardiogram 2D Echocardiogram has been performed.  Calloway Andrus FRANCES 11/28/2013, 10:58 AM

## 2013-11-29 DIAGNOSIS — I6350 Cerebral infarction due to unspecified occlusion or stenosis of unspecified cerebral artery: Principal | ICD-10-CM

## 2013-11-29 DIAGNOSIS — G459 Transient cerebral ischemic attack, unspecified: Secondary | ICD-10-CM

## 2013-11-29 DIAGNOSIS — E785 Hyperlipidemia, unspecified: Secondary | ICD-10-CM

## 2013-11-29 DIAGNOSIS — I10 Essential (primary) hypertension: Secondary | ICD-10-CM

## 2013-11-29 DIAGNOSIS — I6340 Cerebral infarction due to embolism of unspecified cerebral artery: Secondary | ICD-10-CM

## 2013-11-29 LAB — BASIC METABOLIC PANEL
Anion gap: 14 (ref 5–15)
BUN: 16 mg/dL (ref 6–23)
CALCIUM: 9 mg/dL (ref 8.4–10.5)
CO2: 25 meq/L (ref 19–32)
Chloride: 104 mEq/L (ref 96–112)
Creatinine, Ser: 1.02 mg/dL (ref 0.50–1.35)
GFR calc Af Amer: >90 mL/min (ref >90)
GFR calc non Af Amer: 80 mL/min — ABNORMAL LOW (ref >90)
GLUCOSE: 92 mg/dL (ref 70–99)
POTASSIUM: 3.9 meq/L (ref 3.7–5.3)
Sodium: 143 mEq/L (ref 137–147)

## 2013-11-29 MED ORDER — CLOPIDOGREL BISULFATE 75 MG PO TABS
75.0000 mg | ORAL_TABLET | Freq: Every day | ORAL | Status: DC
  Administered 2013-11-29 – 2013-12-01 (×3): 75 mg via ORAL
  Filled 2013-11-29 (×3): qty 1

## 2013-11-29 MED ORDER — PNEUMOCOCCAL VAC POLYVALENT 25 MCG/0.5ML IJ INJ
0.5000 mL | INJECTION | INTRAMUSCULAR | Status: AC
  Administered 2013-11-30: 0.5 mL via INTRAMUSCULAR
  Filled 2013-11-29: qty 0.5

## 2013-11-29 NOTE — Progress Notes (Signed)
Stroke Team Progress Note  HISTORY Jermaine Lowery is a 58 y.o. 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 Patient was not very talkative and answered " yes" or " no" questions, but according to his family he had been having progressive weakness of the left arm since last April, to the point that he could not use his left hand properly and could not get dressed by himself. He denied weakness of the left leg but family stated that he seemed to drag the left leg.  No reported changes in speech, HA, vertigo, double vision, difficulty swallowing, or visual disturbances.  Family reported that last June he had his initial neurological evaluation at [facility] and MRI brain was requested back then and completed on the day of this admission. Patient went home and got a call from his neurologist saying that he had an acute stroke on that MRI and was asked to present to the nearest ER. Unfortunately, that outside MRI was not available for review.  Date last known well: uncertain  Time last known well: uncertain  tPA Given: no, out of the window.    SUBJECTIVE No family members present. The patient denied any new deficits. He is without complaints. Anxious for discharge.  OBJECTIVE Most recent Vital Signs: Filed Vitals:   11/28/13 1732 11/28/13 2134 11/29/13 0144 11/29/13 0510  BP: 126/81 133/89 144/92 148/104  Pulse: 75 72 88 63  Temp: 98.3 F (36.8 C) 98.3 F (36.8 C) 98 F (36.7 C) 97.5 F (36.4 C)  TempSrc: Oral Oral Oral Oral  Resp: 16 16 16 16   Height:      Weight:      SpO2: 100% 98% 98% 99%   CBG (last 3)   Recent Labs  11/27/13 2113  GLUCAP 108*    IV Fluid Intake:     MEDICATIONS  . amLODipine  10 mg Oral Daily  . aspirin  EC  81 mg Oral Daily  . enoxaparin (LOVENOX) injection  30 mg Subcutaneous Q24H  . [START ON 11/30/2013] pneumococcal 23 valent vaccine  0.5 mL Intramuscular Tomorrow-1000  . pravastatin  40 mg Oral q1800  . sertraline  50 mg Oral Daily   PRN:  senna-docusate  Diet:  Cardiac thin liquids Activity:  Up with assistance DVT Prophylaxis:  Lovenox  CLINICALLY SIGNIFICANT STUDIES Basic Metabolic Panel:   Recent Labs Lab 11/27/13 1802 11/28/13 0219 11/29/13 0440  NA 144  --  143  K 3.9  --  3.9  CL 103  --  104  CO2 25  --  25  GLUCOSE 135*  --  92  BUN 19  --  16  CREATININE 1.25 1.18 1.02  CALCIUM 9.5  --  9.0   Liver Function Tests:   Recent Labs Lab 11/27/13 1802  AST 25  ALT 27  ALKPHOS 117  BILITOT 0.3  PROT 7.7  ALBUMIN 3.9   CBC:   Recent Labs Lab 11/27/13 1802 11/28/13 0219  WBC 5.7 6.2  NEUTROABS 3.2  --   HGB 15.1 14.7  HCT 46.3 44.3  MCV 86.5 84.9  PLT 257 249   Coagulation:   Recent Labs Lab 11/27/13 1802  LABPROT 12.8  INR 0.96   Cardiac Enzymes: No results found for this basename: CKTOTAL, CKMB, CKMBINDEX,  TROPONINI,  in the last 168 hours Urinalysis: No results found for this basename: COLORURINE, APPERANCEUR, LABSPEC, [place], GLUCOSEU, HGBUR, BILIRUBINUR, KETONESUR, PROTEINUR, UROBILINOGEN, NITRITE, LEUKOCYTESUR,  in the last 168 hours Lipid Panel    Component Value Date/Time   CHOL 160 11/28/2013 0126   TRIG 91 11/28/2013 0126   HDL 61 11/28/2013 0126   CHOLHDL 2.6 11/28/2013 0126   VLDL 18 11/28/2013 0126   LDLCALC 81 11/28/2013 0126   HgbA1C  Lab Results  Component Value Date   HGBA1C 6.0* 11/27/2013    Urine Drug Screen:     Component Value Date/Time   LABOPIA NONE DETECTED 02/04/2013 1556   COCAINSCRNUR NONE DETECTED 02/04/2013 1556   LABBENZ NONE DETECTED 02/04/2013 1556   AMPHETMU NONE DETECTED 02/04/2013 1556   THCU NONE DETECTED 02/04/2013 1556   LABBARB NONE DETECTED 02/04/2013 1556    Alcohol Level: No results found for  this basename: ETH,  in the last 168 hours  Dg Chest 2 View 11/28/2013    There is no evidence of aspiration or other acute cardiopulmonary disease.    MRI Head Wo Contrast 11/28/2013    1. 9 mm cortically based focus of restricted diffusion within the right frontal lobe, suspicious for acute ischemic infarct.  2. Grossly stable appearance of remote bilateral basal ganglia and left thalamic infarcts. Additional lacunar infarcts within the right thalamus and posterior right centrum semi ovale are new as compared to prior MRI from 02/03/2013, but chronic in appearance on today's study.  3. Global atrophy with moderate to severe white matter disease, grossly stable.    MRA HEAD  11/28/2013 1. Limited study due to motion. No proximal branch occlusion or other acute abnormality within the intracranial circulation.  2. Grossly stable multi focal atherosclerotic irregularity involving the middle cerebral and posterior cerebral artery territories.      Carotid Doppler  Preliminary report: 1-39% ICA stenosis. Vertebral artery flow is antegrade.   2D Echocardiogram ejection fraction 55-65%. No cardiac source of emboli identified.  CXR    EKG - sinus tachycardia rate 102 beats per minute. For complete results please see formal report.   Therapy Recommendations - pending  Physical Exam   NEUROLOGIC:   MENTAL STATUS: awake, alert, oriented, language fluent,  follows simple commands.  CRANIAL NERVES: pupils equal and reactive to light,extraocular muscles intact, facial sensation and intact and symmetric, uvula midlinec, tongue midline. Mild left facial droop. Fundi not visualized. Vision acuity is normal visual fields are full to confrontational testing. Joaw jerk is brisk MOTOR: normal bulk and tone, Strength - intact except for left hand significant weakness and difficulty with fine motor movements. Deep tendon reflexes brisk bilaterally. SENSORY: normal and symmetric to light touch   COORDINATION: finger-nose-finger normal - heel to shin normal  REFLEXES: deep tendon reflexes present and symmetric - no babinski    ASSESSMENT Mr. Jermaine Lowery is a 58 y.o. male presenting with an abnormal MRI performed at the [facility]. T-PA therapy was not initiated secondary to late presentation. MRI - right frontal lobe, suspicious for acute ischemic infarct. Infarct felt to be silent and secondary to small vessel disease. On aspirin 81 mg orally every day prior to admission. Now on Plavix 75 mg daily for secondary stroke prevention. Patient with resultant left hand weakness from previous stroke. Stroke work up underway.   Hyperlipidemia  - Cholesterol 160; LDL 81 - Zocor prior to admission  Hypertension  Hemoglobin A1c 5.9  Ongoing tobacco use  Previous TIA/stroke  Hospital day # 2  TREATMENT/PLAN  Change aspirin to clopidogrel 75 mg orally every day for secondary stroke prevention.  Await therapy evaluations.  Counseled patient to quit smoking.  Will sing off call with questions  Lowry Ram [facility] Pager [phone] 11/29/2013, 9:34 AM  I have personally examined this patient, reviewed notes, independently viewed imaging studies, participated in medical decision making and plan of care. I have made any additions or clarifications directly to the above note. Agree with note above. He has had previous strokes and is at risk for getting worse and getting even more disabling strokes.  Leotis Pain        To contact Stroke Continuity provider, please refer to [email or web address] After hours, contact General Neurology

## 2013-11-29 NOTE — Evaluation (Addendum)
Speech Language Pathology Evaluation Patient Details Name: Jermaine Lowery MRN: [id number] DOB: April 01, 1956 Today's Date: 11/29/2013 Time: 2119-4174 SLP Time Calculation (min): 18 min  Problem List:  Patient Active Problem List   Diagnosis Date Noted  . Stroke 11/28/2013  . CVA (cerebral infarction) 11/28/2013  . TIA (transient ischemic attack) 02/03/2013  . Renal failure 02/03/2013  . HTN (hypertension) 02/03/2013  . HLD (hyperlipidemia) 02/03/2013   Past Medical History:  Past Medical History  Diagnosis Date  . Stroke   . Hypertension   . Prostate cancer    Past Surgical History:  Past Surgical History  Procedure Laterality Date  . Rotator cuff repair    . Prostate cacner     HPI:  58 y.o. male with PMH of CVA, HTN, HLD presenting with CVA. MRI 7/30 revealed suspected R frontal lobe acute infarct with remote bilateral basal ganglia/ L thalamic infarcts. SLP eval ordered as part of stroke workup.   Assessment / Plan / Recommendation Clinical Impression  Pt's overall motor speech and language skills are within normal limits for tasks assessed; the pt's verbal responses were brief throughout assessment but no signs of aphasia. Most cognitive skills were also within normal limits for tasks assessed, exception being reduced awareness of difficulties- pt reported no difficulties with mobility or when working with PT; however PT noted assistance needed with transfers and ambulation. Additionally after leaving room, pt attempted to get up without assistance; bed alarm sounded and RN immediately responded. No difficulties with basic reading/ written expression. No motor speech difficulties noted despite L labial asymmetry; pt is 100% intelligible. Given these findings, pt would benefit from ST to address safety awareness and recall of new information. No family at bedside today to determine baseline. Will continue to follow while in acute setting.    SLP Assessment  Patient needs  further Speech Lanaguage Pathology Services    Follow Up Recommendations  24 hr supervision/ assistance; home health   Frequency and Duration 2x/ week 2 weeks      Pertinent Vitals/Pain n/a   SLP Goals     SLP Evaluation Prior Functioning  Cognitive/Linguistic Baseline: Within functional limits Type of Home: House  Lives With: Family Available Help at Discharge: Family;Available PRN/intermittently   Cognition  Overall Cognitive Status: Within Functional Limits for tasks assessed Arousal/Alertness: Awake/alert Orientation Level: Oriented X4 Attention: Selective Selective Attention: Appears intact Memory: Appears intact Awareness: Impaired Problem Solving: Appears intact Safety/Judgment: Impaired Comments: Reports not needing help with mobility    Comprehension  Auditory Comprehension Overall Auditory Comprehension: Appears within functional limits for tasks assessed Yes/No Questions: Within Functional Limits Commands: Within Functional Limits Conversation: Complex Reading Comprehension Reading Status: Within funtional limits    Expression Expression Primary Mode of Expression: Verbal Verbal Expression Overall Verbal Expression: Appears within functional limits for tasks assessed Initiation: No impairment Level of Generative/Spontaneous Verbalization: Conversation Repetition: No impairment Naming: No impairment Pragmatics: No impairment Written Expression Dominant Hand: Right Written Expression: Within Functional Limits   Oral / Motor Oral Motor/Sensory Function Overall Oral Motor/Sensory Function: Impaired at baseline Labial ROM: Reduced left Labial Symmetry: Abnormal symmetry left Motor Speech Overall Motor Speech: Appears within functional limits for tasks assessed   GO     Kern Reap, MA, CCC-SLP 11/29/2013, 3:01 PM

## 2013-11-29 NOTE — Progress Notes (Signed)
VASCULAR LAB PRELIMINARY  PRELIMINARY  PRELIMINARY  PRELIMINARY  Carotid Dopplers completed.    Preliminary report:  1-39% ICA stenosis.  Vertebral artery flow is antegrade.  Jamieon Lannen, RVT 11/29/2013, 9:55 AM

## 2013-11-29 NOTE — Progress Notes (Signed)
[facility] PROGRESS NOTE   Jermaine Lowery WUJ:[id number] DOB: [birth year] DOA: 11/27/2013 PCP: [name], MD  HPI/Subjective: Alert, awake and oriented x3. Denies any complaints.  Assessment/Plan: Active Problems:   HTN (hypertension)   HLD (hyperlipidemia)   Stroke   CVA (cerebral infarction)   CVA  -Based on family history, patient had recent history of confusion, MRI done as outpatient in the [place] [facility]. -Repeat MRI done in the hospital showed 9 mm ischemic infarct in the right frontal lobe. -PT/OT to evaluate the patient. -Per family patient has issues with compliance with medications, patient counseled extensively.  HTN  -continue home regimen   HLD  -statin   Tobacco abuse  -smoking cessation consult    Code Status: Full code Family Communication: Plan discussed with the patient. Disposition Plan: Remains inpatient   Consultants:  Neurology/stroke team  Procedures:  None  Antibiotics:  None   Objective: Filed Vitals:   11/29/13 0510  BP: 148/104  Pulse: 63  Temp: 97.5 F (36.4 C)  Resp: 16    Intake/Output Summary (Last 24 hours) at 11/29/13 1006 Last data filed at 11/28/13 1700  Gross per 24 hour  Intake    900 ml  Output      0 ml  Net    900 ml   Filed Weights   11/28/13 0100  Weight: 70.398 kg (155 lb 3.2 oz)    Exam: General: Alert and awake, oriented x3, not in any acute distress. HEENT: anicteric sclera, pupils reactive to light and accommodation, EOMI CVS: S1-S2 clear, no murmur rubs or gallops Chest: clear to auscultation bilaterally, no wheezing, rales or rhonchi Abdomen: soft nontender, nondistended, normal bowel sounds, no organomegaly Extremities: no cyanosis, clubbing or edema noted bilaterally Neuro: Cranial nerves II-XII intact, no focal neurological deficits  Data Reviewed: Basic Metabolic Panel:  Recent Labs Lab 11/27/13 1802 11/28/13 0219 11/29/13 0440  NA 144  --  143  K 3.9  --   3.9  CL 103  --  104  CO2 25  --  25  GLUCOSE 135*  --  92  BUN 19  --  16  CREATININE 1.25 1.18 1.02  CALCIUM 9.5  --  9.0   Liver Function Tests:  Recent Labs Lab 11/27/13 1802  AST 25  ALT 27  ALKPHOS 117  BILITOT 0.3  PROT 7.7  ALBUMIN 3.9   No results found for this basename: LIPASE, AMYLASE,  in the last 168 hours No results found for this basename: AMMONIA,  in the last 168 hours CBC:  Recent Labs Lab 11/27/13 1802 11/28/13 0219  WBC 5.7 6.2  NEUTROABS 3.2  --   HGB 15.1 14.7  HCT 46.3 44.3  MCV 86.5 84.9  PLT 257 249   Cardiac Enzymes: No results found for this basename: CKTOTAL, CKMB, CKMBINDEX, TROPONINI,  in the last 168 hours BNP (last 3 results) No results found for this basename: PROBNP,  in the last 8760 hours CBG:  Recent Labs Lab 11/27/13 2113  GLUCAP 108*    Micro No results found for this or any previous visit (from the past 240 hour(s)).   Studies: Dg Chest 2 View  11/28/2013   CLINICAL DATA:  Weakness ; stroke symptoms  EXAM: CHEST  2 VIEW  COMPARISON:  PA and lateral chest of August 06, 2009.  FINDINGS: The lungs are adequately inflated and clear. The heart and pulmonary vascularity are normal. There is no pleural effusion. The bony thorax is unremarkable.  IMPRESSION:  There is no evidence of aspiration or other acute cardiopulmonary disease.   Electronically Signed   By: David  Martinique   On: 11/28/2013 07:40   [name] Head Wo Contrast  11/28/2013   CLINICAL DATA:  Prior study from 02/03/2013  Advanced chronic microvascular  EXAM: MRI HEAD WITHOUT CONTRAST  MRA HEAD WITHOUT CONTRAST  TECHNIQUE: Multiplanar, multiecho pulse sequences of the brain and surrounding structures were obtained without intravenous contrast. Angiographic images of the head were obtained using MRA technique without contrast.  COMPARISON:  Prior MRI from 02/03/2013 and MRA from 02/04/2013.  FINDINGS: MRI HEAD FINDINGS  Diffuse prominence of the CSF containing spaces is  compatible with generalized cerebral atrophy. Scattered and confluent T2 hyperintensity within the periventricular and deep white matter both cerebral hemispheres is present, most consistent with advanced chronic microvascular ischemic disease.  Remote infarcts involving the bilateral basal ganglia and left thalamus are again seen. There is a new 7 mm remote lacunar infarct involving the right thalamus (series 7, image 11). A remote infarct involving the posterior right centrum semi ovale demonstrates evidence of prior hemorrhage (series 9, image 15), also new from prior. Associated mildly increased heterogeneous DWI signal intensity about this region is likely related to mineralization, and is artifactual nature.  A 9 mm linear focus of restricted diffusion seen involving the cortex of the right frontal lobe (series 4, image 20), suspicious for acute ischemic infarct. No associated hemorrhage seen on gradient echo sequence. No significant edema. Additional 6 mm focus of restricted diffusion seen more anteriorly within the centrum semi ovale of the right frontal lobe is present on coronal DWI sequence (series 6, image 19), which may reflect a additional small infarct. No other definite acute infarcts identified. Evaluation is somewhat limited due to lack of a FLAIR sequence.  No mass or midline shift. Ventriculomegaly present related to global parenchymal cerebral volume loss. No hydrocephalus. Pituitary gland normal. No acute abnormality seen about the orbits.  Craniocervical junction within normal limits. Degenerative changes seen within the partially visualized upper cervical spine. Bone marrow signal intensity normal. Mild degenerative osteoarthrosis noted about the temporomandibular joints bilaterally.  Scalp soft tissues within normal limits.  Paranasal sinuses and mastoid air cells are clear.  MRA HEAD FINDINGS  Evaluation is limited by motion artifact.  The distal cervical and petrous segments of the  internal carotid arteries are symmetric in appearance with patent antegrade flow. Evaluation of the cavernous segments of the internal carotid arteries is limited, however, no definite hemodynamically significant stenosis identified. Supra clinoid segments grossly normal. A1 segments are symmetric without abnormality. Anterior communicating artery and anterior cerebral arteries are grossly unremarkable.  Multi focal atherosclerotic irregularity present within the M1 segments bilaterally. No proximal branch occlusion. Distal MCA branches grossly normal.  The left vertebral artery is dominant with a diminutive right vertebral artery. The posterior inferior cerebral arteries not well evaluated on this study. Vertebrobasilar junction grossly normal. No hemodynamically significant stenosis seen within the basilar artery. Multi focal atherosclerotic irregularity again noted within the P2 segments bilaterally. Previously identified stenosis within the mid right P2 segment is likely unchanged. Superior cerebellar arteries not well evaluated on this study.  IMPRESSION: MRI HEAD IMPRESSION:  1. 9 mm cortically based focus of restricted diffusion within the right frontal lobe, suspicious for acute ischemic infarct. 2. Grossly stable appearance of remote bilateral basal ganglia and left thalamic infarcts. Additional lacunar infarcts within the right thalamus and posterior right centrum semi ovale are new as compared to prior  MRI from 02/03/2013, but chronic in appearance on today's study. 3. Global atrophy with moderate to severe white matter disease, grossly stable.  MRA HEAD IMPRESSION:  1. Limited study due to motion. No proximal branch occlusion or other acute abnormality within the intracranial circulation. 2. Grossly stable multi focal atherosclerotic irregularity involving the middle cerebral and posterior cerebral artery territories.   Electronically Signed   By: Jeannine Boga M.D.   On: 11/28/2013 04:13   Mr  Brain Wo Contrast  11/28/2013   CLINICAL DATA:  Prior study from 02/03/2013  Advanced chronic microvascular  EXAM: MRI HEAD WITHOUT CONTRAST  MRA HEAD WITHOUT CONTRAST  TECHNIQUE: Multiplanar, multiecho pulse sequences of the brain and surrounding structures were obtained without intravenous contrast. Angiographic images of the head were obtained using MRA technique without contrast.  COMPARISON:  Prior MRI from 02/03/2013 and MRA from 02/04/2013.  FINDINGS: MRI HEAD FINDINGS  Diffuse prominence of the CSF containing spaces is compatible with generalized cerebral atrophy. Scattered and confluent T2 hyperintensity within the periventricular and deep white matter both cerebral hemispheres is present, most consistent with advanced chronic microvascular ischemic disease.  Remote infarcts involving the bilateral basal ganglia and left thalamus are again seen. There is a new 7 mm remote lacunar infarct involving the right thalamus (series 7, image 11). A remote infarct involving the posterior right centrum semi ovale demonstrates evidence of prior hemorrhage (series 9, image 15), also new from prior. Associated mildly increased heterogeneous DWI signal intensity about this region is likely related to mineralization, and is artifactual nature.  A 9 mm linear focus of restricted diffusion seen involving the cortex of the right frontal lobe (series 4, image 20), suspicious for acute ischemic infarct. No associated hemorrhage seen on gradient echo sequence. No significant edema. Additional 6 mm focus of restricted diffusion seen more anteriorly within the centrum semi ovale of the right frontal lobe is present on coronal DWI sequence (series 6, image 19), which may reflect a additional small infarct. No other definite acute infarcts identified. Evaluation is somewhat limited due to lack of a FLAIR sequence.  No mass or midline shift. Ventriculomegaly present related to global parenchymal cerebral volume loss. No  hydrocephalus. Pituitary gland normal. No acute abnormality seen about the orbits.  Craniocervical junction within normal limits. Degenerative changes seen within the partially visualized upper cervical spine. Bone marrow signal intensity normal. Mild degenerative osteoarthrosis noted about the temporomandibular joints bilaterally.  Scalp soft tissues within normal limits.  Paranasal sinuses and mastoid air cells are clear.  MRA HEAD FINDINGS  Evaluation is limited by motion artifact.  The distal cervical and petrous segments of the internal carotid arteries are symmetric in appearance with patent antegrade flow. Evaluation of the cavernous segments of the internal carotid arteries is limited, however, no definite hemodynamically significant stenosis identified. Supra clinoid segments grossly normal. A1 segments are symmetric without abnormality. Anterior communicating artery and anterior cerebral arteries are grossly unremarkable.  Multi focal atherosclerotic irregularity present within the M1 segments bilaterally. No proximal branch occlusion. Distal MCA branches grossly normal.  The left vertebral artery is dominant with a diminutive right vertebral artery. The posterior inferior cerebral arteries not well evaluated on this study. Vertebrobasilar junction grossly normal. No hemodynamically significant stenosis seen within the basilar artery. Multi focal atherosclerotic irregularity again noted within the P2 segments bilaterally. Previously identified stenosis within the mid right P2 segment is likely unchanged. Superior cerebellar arteries not well evaluated on this study.  IMPRESSION: MRI HEAD IMPRESSION:  1.  9 mm cortically based focus of restricted diffusion within the right frontal lobe, suspicious for acute ischemic infarct. 2. Grossly stable appearance of remote bilateral basal ganglia and left thalamic infarcts. Additional lacunar infarcts within the right thalamus and posterior right centrum semi ovale  are new as compared to prior MRI from 02/03/2013, but chronic in appearance on today's study. 3. Global atrophy with moderate to severe white matter disease, grossly stable.  MRA HEAD IMPRESSION:  1. Limited study due to motion. No proximal branch occlusion or other acute abnormality within the intracranial circulation. 2. Grossly stable multi focal atherosclerotic irregularity involving the middle cerebral and posterior cerebral artery territories.   Electronically Signed   By: Jeannine Boga M.D.   On: 11/28/2013 04:13    Scheduled Meds: . amLODipine  10 mg Oral Daily  . aspirin EC  81 mg Oral Daily  . enoxaparin (LOVENOX) injection  30 mg Subcutaneous Q24H  . [START ON 11/30/2013] pneumococcal 23 valent vaccine  0.5 mL Intramuscular Tomorrow-1000  . pravastatin  40 mg Oral q1800  . sertraline  50 mg Oral Daily   Continuous Infusions:      Time spent: 35 minutes    [facility]  [facility] Pager [phone] If 7PM-7AM, please contact night-coverage at [email or web address], password [facility] 11/29/2013, 10:06 AM  LOS: 2 days

## 2013-11-29 NOTE — Evaluation (Signed)
Physical Therapy Evaluation Patient Details Name: Jermaine Lowery MRN: [id number] DOB: February 12, 1956 Today's Date: 11/29/2013   History of Present Illness  Jermaine Lowery is a 58 y.o. 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  Clinical Impression  Patient presents with problems listed below.  Will benefit from acute PT to maximize independence prior to return home with sister.  Recommended to patient to use his new RW for balance/safety.  Recommend HHPT to continue therapy at discharge.    Follow Up Recommendations Home health PT;Supervision - Intermittent    Equipment Recommendations  None recommended by PT (Patient has a RW)    Recommendations for Other Services       Precautions / Restrictions Precautions Precautions: Fall Precaution Comments: Reports no falls.  [facility] just provided patient with RW on 11/28/13. Restrictions Weight Bearing Restrictions: No      Mobility  Bed Mobility Overal bed mobility: Modified Independent             General bed mobility comments: Verbal cues for technique.  Able to move to sitting and back to supine with supervision.  Patient using bedrail for assist.  Transfers Overall transfer level: Needs assistance Equipment used: None Transfers: Sit to/from Stand Sit to Stand: Min guard         General transfer comment: Assist for balance and safety with transfers.  Ambulation/Gait Ambulation/Gait assistance: Min assist Ambulation Distance (Feet): 180 Feet Assistive device: None Gait Pattern/deviations: Step-through pattern;Decreased step length - right;Decreased step length - left;Decreased stride length;Decreased weight shift to left;Ataxic;Staggering left;Staggering right Gait velocity:  Decreased Gait velocity interpretation: Below normal speed for age/gender General Gait Details: Patient with decreased balance with gait, staggering to both sides.  Required assist to prevent falls, especially during turns, or with head turns.  Stairs            Wheelchair Mobility    Modified Rankin (Stroke Patients Only) Modified Rankin (Stroke Patients Only) Pre-Morbid Rankin Score: Slight disability Modified Rankin: Moderately severe disability     Balance Overall balance assessment: Needs assistance                           High level balance activites: Turns;Sudden stops;Head turns High Level Balance Comments: Decreased balance with high level balance activities, requiring assist.             Pertinent Vitals/Pain     Home Living Family/patient expects to be discharged to:: Private residence Living Arrangements: Other relatives (sister) Available Help at Discharge: Family;Available PRN/intermittently (sister works) Type of Home: House Home Access: Stairs to enter Entrance Stairs-Rails: None Technical brewer of Steps: 4 Home Layout: One level Home Equipment: Environmental consultant - 2 wheels      Prior Function Level of Independence: Independent               Hand Dominance   Dominant Hand: Right    Extremity/Trunk Assessment   Upper Extremity Assessment: LUE deficits/detail       LUE Deficits / Details: Decreased strength - 4-/5.  Increased flexor tone.   Lower Extremity Assessment: Generalized weakness      Cervical / Trunk Assessment: Normal  Communication   Communication: No difficulties  Cognition Arousal/Alertness: Awake/alert Behavior During Therapy: Flat affect Overall Cognitive Status:  Within Functional Limits for tasks assessed (Decreased safety awareness at times)                      General Comments      Exercises        Assessment/Plan    PT Assessment Patient needs continued PT services  PT  Diagnosis Abnormality of gait;Generalized weakness;Hemiplegia non-dominant side   PT Problem List Decreased strength;Decreased balance;Decreased mobility;Decreased knowledge of use of DME;Decreased safety awareness;Impaired tone  PT Treatment Interventions DME instruction;Gait training;Stair training;Functional mobility training;Therapeutic activities;Balance training;Patient/family education   PT Goals (Current goals can be found in the Care Plan section) Acute Rehab PT Goals Patient Stated Goal: To return home PT Goal Formulation: With patient Time For Goal Achievement: 12/06/13 Potential to Achieve Goals: Good    Frequency Min 4X/week   Barriers to discharge Decreased caregiver support Lives with sister who works during day.    Co-evaluation               End of Session Equipment Utilized During Treatment: Gait belt Activity Tolerance: Patient tolerated treatment well Patient left: in bed;with call bell/phone within reach;with bed alarm set Nurse Communication: Mobility status         Time: 1046-1101 PT Time Calculation (min): 15 min   Charges:   PT Evaluation $Initial PT Evaluation Tier I: 1 Procedure PT Treatments $Gait Training: 8-22 mins   PT G Codes:          Despina Pole 11/29/2013, 1:45 PM Carita Pian. Sanjuana Kava, [place] Pager [phone]

## 2013-11-30 MED ORDER — CLOPIDOGREL BISULFATE 75 MG PO TABS: 75.0000 mg | ORAL_TABLET | Freq: Every day | ORAL | Status: AC

## 2013-11-30 NOTE — Progress Notes (Signed)
Physical Therapy Treatment Patient Details Name: Jermaine Lowery MRN: [id number] DOB: 07/20/1955 Today's Date: 11/30/2013    History of Present Illness Jermaine Lowery is a 58 y.o. 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    PT Comments    Patient requiring increased assist with gait today due to decreased balance.  Will need more practice using RW with gait.  As this note is being written, patient's chair alarm went off.  As PT entered room, patient getting from chair back to bed.  Assisted patient back to bed and placed bed alarm on.  Spoke with patient about calling for assist and not getting up on his own.  RN notified.   Follow Up Recommendations  Home health PT;Supervision - Intermittent     Equipment Recommendations  None recommended by PT    Recommendations for Other Services       Precautions / Restrictions Precautions Precautions: Fall Precaution Comments: Reports no falls.  [facility] just [facility] patient with RW on 11/28/13. Restrictions Weight Bearing Restrictions: No    Mobility  Bed Mobility Overal bed mobility: Modified Independent             General bed mobility comments: Uses bed rail to move to sitting.  Transfers Overall transfer level: Needs assistance Equipment used: Rolling walker (2 wheeled) Transfers: Sit to/from Stand Sit to Stand: Min guard         General transfer comment: Verbal and tactile cues for hand placement - patient attempting to pull up using RW.  Assist with balance and safety from bed and toilet.  Ambulation/Gait Ambulation/Gait assistance: Min assist Ambulation Distance (Feet): 250 Feet Assistive device: Rolling walker (2 wheeled) Gait Pattern/deviations: Step-through  pattern;Decreased step length - right;Decreased step length - left;Decreased stride length;Decreased weight shift to right;Shuffle;Ataxic;Staggering left;Drifts right/left Gait velocity: Decreased Gait velocity interpretation: Below normal speed for age/gender General Gait Details: Patient leaning to left during gait today with RW.  Verbal and tactile cues to remain in midline stance.  Patient requiring verbal cues and physical assist to use RW safely.  Walks inside RW but to the far left, with left foot hitting leg of RW at times.  Assist during turns for balance/safety.   Stairs            Wheelchair Mobility    Modified Rankin (Stroke Patients Only) Modified Rankin (Stroke Patients Only) Pre-Morbid Rankin Score: Slight disability Modified Rankin: Moderately severe disability     Balance Overall balance assessment: Needs assistance         Standing balance support: Bilateral upper extremity supported;During functional activity Standing balance-Leahy Scale: Poor Standing balance comment: Leaning to left.  Loss of balance during turns.                    Cognition Arousal/Alertness: Awake/alert Behavior During Therapy: Flat affect Overall Cognitive Status: Within Functional Limits for tasks assessed (Decreased safety awareness at times - ? baseline)                      Exercises      General Comments        Pertinent Vitals/Pain     Home Living  Prior Function            PT Goals (current goals can now be found in the care plan section) Progress towards PT goals: Progressing toward goals    Frequency  Min 4X/week    PT Plan Current plan remains appropriate    Co-evaluation             End of Session Equipment Utilized During Treatment: Gait belt Activity Tolerance: Patient tolerated treatment well Patient left: in chair;with call bell/phone within reach;with chair alarm set Patient visible on  camera at front desk.    Time: 8325-4982 PT Time Calculation (min): 24 min  Charges:  $Gait Training: 23-37 mins                    G Codes:      Despina Pole 2013/12/04, 10:01 AM Carita Pian. Sanjuana Kava, [place] Pager [phone]
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
## 2013-11-30 NOTE — Discharge Summary (Signed)
Physician Discharge Summary  Jermaine Lowery CNO:[id number] DOB: [birth year] DOA: 11/27/2013  PCP: Jermaine Mccreedy, MD  Admit date: 11/27/2013 Discharge date: 11/30/2013  Time spent: 40 minutes  Recommendations for Outpatient Follow-up:  1. Follow up with PCP in 1-2 weeks  Discharge Diagnoses:  Active Problems:   HTN (hypertension)   HLD (hyperlipidemia)   Stroke   CVA (cerebral infarction)   Discharge Condition: Stable  Diet recommendation: Heart healthy  Filed Weights   11/28/13 0100  Weight: 70.398 kg (155 lb 3.2 oz)    History of present illness:  See admit h and p from 7/31 for details. Briefly, pt presents from [place] where MRI demonstrated acute CVA. Pt was subsequently admitted to [facility] for further evaluation and management  Hospital Course:  CVA  -MRI done in the hospital revealing 9 mm ischemic infarct in the right frontal lobe.  -Therapy recs for home therapy -Per family patient has issues with compliance with medications, patient counseled extensively.  -Neurology was consulted -Antiplatelet changed to plavix from ASA per neurology recommendations HTN  -continue home regimen  HLD  -statin  Tobacco abuse  -smoking cessation consult   Consultations:  Neurology  Discharge Exam: Filed Vitals:   11/29/13 2158 11/30/13 0208 11/30/13 0529 11/30/13 0924  BP: 152/98 138/83 131/88 149/88  Pulse: 66 66 78 90  Temp: 98.5 F (36.9 C) 97.6 F (36.4 C) 97.9 F (36.6 C) 98.4 F (36.9 C)  TempSrc: Oral Oral Oral Oral  Resp: 18 18 16 16   Height:      Weight:      SpO2: 99% 98% 99% 98%    General: Awake, in nad Cardiovascular: regular, s1, s2 Respiratory: normal resp effort, no wheezing  Discharge Instructions     Medication List         amLODipine 10 MG tablet  Commonly known as:  NORVASC  Take 10 mg by mouth daily.     aspirin EC 81 MG tablet  Take 81 mg by mouth daily.     clopidogrel 75 MG tablet  Commonly known as:  PLAVIX  Take 1 tablet (75  mg total) by mouth daily.     pravastatin 80 MG tablet  Commonly known as:  PRAVACHOL  Take 40 mg by mouth daily.     sertraline 100 MG tablet  Commonly known as:  ZOLOFT  Take 50 mg by mouth daily.       Allergies  Allergen Reactions  . Naproxen Rash   Follow-up Information   Follow up with Lowery, HARVEY, MD. Schedule an appointment as soon as possible for a visit in 1 week.   Specialty:  Family Medicine   Contact information:   [address] [place] [address] [phone]        The results of significant diagnostics from this hospitalization (including imaging, microbiology, ancillary and laboratory) are listed below for reference.    Significant Diagnostic Studies: Dg Chest 2 View  11/28/2013   CLINICAL DATA:  Weakness ; stroke symptoms  EXAM: CHEST  2 VIEW  COMPARISON:  PA and lateral chest of August 06, 2009.  FINDINGS: The lungs are adequately inflated and clear. The heart and pulmonary vascularity are normal. There is no pleural effusion. The bony thorax is unremarkable.  IMPRESSION: There is no evidence of aspiration or other acute cardiopulmonary disease.   Electronically Signed   By: David  Martinique   On: 11/28/2013 07:40   [name] Head Wo Contrast  11/28/2013   CLINICAL DATA:  Prior  study from 02/03/2013  Advanced chronic microvascular  EXAM: MRI HEAD WITHOUT CONTRAST  MRA HEAD WITHOUT CONTRAST  TECHNIQUE: Multiplanar, multiecho pulse sequences of the brain and surrounding structures were obtained without intravenous contrast. Angiographic images of the head were obtained using MRA technique without contrast.  COMPARISON:  Prior MRI from 02/03/2013 and MRA from 02/04/2013.  FINDINGS: MRI HEAD FINDINGS  Diffuse prominence of the CSF containing spaces is compatible with generalized cerebral atrophy. Scattered and confluent T2 hyperintensity within the periventricular and deep white matter both cerebral hemispheres is present, most consistent with advanced chronic  microvascular ischemic disease.  Remote infarcts involving the bilateral basal ganglia and left thalamus are again seen. There is a new 7 mm remote lacunar infarct involving the right thalamus (series 7, image 11). A remote infarct involving the posterior right centrum semi ovale demonstrates evidence of prior hemorrhage (series 9, image 15), also new from prior. Associated mildly increased heterogeneous DWI signal intensity about this region is likely related to mineralization, and is artifactual nature.  A 9 mm linear focus of restricted diffusion seen involving the cortex of the right frontal lobe (series 4, image 20), suspicious for acute ischemic infarct. No associated hemorrhage seen on gradient echo sequence. No significant edema. Additional 6 mm focus of restricted diffusion seen more anteriorly within the centrum semi ovale of the right frontal lobe is present on coronal DWI sequence (series 6, image 19), which may reflect a additional small infarct. No other definite acute infarcts identified. Evaluation is somewhat limited due to lack of a FLAIR sequence.  No mass or midline shift. Ventriculomegaly present related to global parenchymal cerebral volume loss. No hydrocephalus. Pituitary gland normal. No acute abnormality seen about the orbits.  Craniocervical junction within normal limits. Degenerative changes seen within the partially visualized upper cervical spine. Bone marrow signal intensity normal. Mild degenerative osteoarthrosis noted about the temporomandibular joints bilaterally.  Scalp soft tissues within normal limits.  Paranasal sinuses and mastoid air cells are clear.  MRA HEAD FINDINGS  Evaluation is limited by motion artifact.  The distal cervical and petrous segments of the internal carotid arteries are symmetric in appearance with patent antegrade flow. Evaluation of the cavernous segments of the internal carotid arteries is limited, however, no definite hemodynamically significant  stenosis identified. Supra clinoid segments grossly normal. A1 segments are symmetric without abnormality. Anterior communicating artery and anterior cerebral arteries are grossly unremarkable.  Multi focal atherosclerotic irregularity present within the M1 segments bilaterally. No proximal branch occlusion. Distal MCA branches grossly normal.  The left vertebral artery is dominant with a diminutive right vertebral artery. The posterior inferior cerebral arteries not well evaluated on this study. Vertebrobasilar junction grossly normal. No hemodynamically significant stenosis seen within the basilar artery. Multi focal atherosclerotic irregularity again noted within the P2 segments bilaterally. Previously identified stenosis within the mid right P2 segment is likely unchanged. Superior cerebellar arteries not well evaluated on this study.  IMPRESSION: MRI HEAD IMPRESSION:  1. 9 mm cortically based focus of restricted diffusion within the right frontal lobe, suspicious for acute ischemic infarct. 2. Grossly stable appearance of remote bilateral basal ganglia and left thalamic infarcts. Additional lacunar infarcts within the right thalamus and posterior right centrum semi ovale are new as compared to prior MRI from 02/03/2013, but chronic in appearance on today's study. 3. Global atrophy with moderate to severe white matter disease, grossly stable.  MRA HEAD IMPRESSION:  1. Limited study due to motion. No proximal branch occlusion or other acute abnormality  within the intracranial circulation. 2. Grossly stable multi focal atherosclerotic irregularity involving the middle cerebral and posterior cerebral artery territories.   Electronically Signed   By: Jeannine Boga M.D.   On: 11/28/2013 04:13   Mr Brain Wo Contrast  11/28/2013   CLINICAL DATA:  Prior study from 02/03/2013  Advanced chronic microvascular  EXAM: MRI HEAD WITHOUT CONTRAST  MRA HEAD WITHOUT CONTRAST  TECHNIQUE: Multiplanar, multiecho pulse  sequences of the brain and surrounding structures were obtained without intravenous contrast. Angiographic images of the head were obtained using MRA technique without contrast.  COMPARISON:  Prior MRI from 02/03/2013 and MRA from 02/04/2013.  FINDINGS: MRI HEAD FINDINGS  Diffuse prominence of the CSF containing spaces is compatible with generalized cerebral atrophy. Scattered and confluent T2 hyperintensity within the periventricular and deep white matter both cerebral hemispheres is present, most consistent with advanced chronic microvascular ischemic disease.  Remote infarcts involving the bilateral basal ganglia and left thalamus are again seen. There is a new 7 mm remote lacunar infarct involving the right thalamus (series 7, image 11). A remote infarct involving the posterior right centrum semi ovale demonstrates evidence of prior hemorrhage (series 9, image 15), also new from prior. Associated mildly increased heterogeneous DWI signal intensity about this region is likely related to mineralization, and is artifactual nature.  A 9 mm linear focus of restricted diffusion seen involving the cortex of the right frontal lobe (series 4, image 20), suspicious for acute ischemic infarct. No associated hemorrhage seen on gradient echo sequence. No significant edema. Additional 6 mm focus of restricted diffusion seen more anteriorly within the centrum semi ovale of the right frontal lobe is present on coronal DWI sequence (series 6, image 19), which may reflect a additional small infarct. No other definite acute infarcts identified. Evaluation is somewhat limited due to lack of a FLAIR sequence.  No mass or midline shift. Ventriculomegaly present related to global parenchymal cerebral volume loss. No hydrocephalus. Pituitary gland normal. No acute abnormality seen about the orbits.  Craniocervical junction within normal limits. Degenerative changes seen within the partially visualized upper cervical spine. Bone marrow  signal intensity normal. Mild degenerative osteoarthrosis noted about the temporomandibular joints bilaterally.  Scalp soft tissues within normal limits.  Paranasal sinuses and mastoid air cells are clear.  MRA HEAD FINDINGS  Evaluation is limited by motion artifact.  The distal cervical and petrous segments of the internal carotid arteries are symmetric in appearance with patent antegrade flow. Evaluation of the cavernous segments of the internal carotid arteries is limited, however, no definite hemodynamically significant stenosis identified. Supra clinoid segments grossly normal. A1 segments are symmetric without abnormality. Anterior communicating artery and anterior cerebral arteries are grossly unremarkable.  Multi focal atherosclerotic irregularity present within the M1 segments bilaterally. No proximal branch occlusion. Distal MCA branches grossly normal.  The left vertebral artery is dominant with a diminutive right vertebral artery. The posterior inferior cerebral arteries not well evaluated on this study. Vertebrobasilar junction grossly normal. No hemodynamically significant stenosis seen within the basilar artery. Multi focal atherosclerotic irregularity again noted within the P2 segments bilaterally. Previously identified stenosis within the mid right P2 segment is likely unchanged. Superior cerebellar arteries not well evaluated on this study.  IMPRESSION: MRI HEAD IMPRESSION:  1. 9 mm cortically based focus of restricted diffusion within the right frontal lobe, suspicious for acute ischemic infarct. 2. Grossly stable appearance of remote bilateral basal ganglia and left thalamic infarcts. Additional lacunar infarcts within the right thalamus and posterior right  centrum semi ovale are new as compared to prior MRI from 02/03/2013, but chronic in appearance on today's study. 3. Global atrophy with moderate to severe white matter disease, grossly stable.  MRA HEAD IMPRESSION:  1. Limited study due to  motion. No proximal branch occlusion or other acute abnormality within the intracranial circulation. 2. Grossly stable multi focal atherosclerotic irregularity involving the middle cerebral and posterior cerebral artery territories.   Electronically Signed   By: Jeannine Boga M.D.   On: 11/28/2013 04:13    Microbiology: No results found for this or any previous visit (from the past 240 hour(s)).   Labs: Basic Metabolic Panel:  Recent Labs Lab 11/27/13 1802 11/28/13 0219 11/29/13 0440  NA 144  --  143  K 3.9  --  3.9  CL 103  --  104  CO2 25  --  25  GLUCOSE 135*  --  92  BUN 19  --  16  CREATININE 1.25 1.18 1.02  CALCIUM 9.5  --  9.0   Liver Function Tests:  Recent Labs Lab 11/27/13 1802  AST 25  ALT 27  ALKPHOS 117  BILITOT 0.3  PROT 7.7  ALBUMIN 3.9   No results found for this basename: LIPASE, AMYLASE,  in the last 168 hours No results found for this basename: AMMONIA,  in the last 168 hours CBC:  Recent Labs Lab 11/27/13 1802 11/28/13 0219  WBC 5.7 6.2  NEUTROABS 3.2  --   HGB 15.1 14.7  HCT 46.3 44.3  MCV 86.5 84.9  PLT 257 249   Cardiac Enzymes: No results found for this basename: CKTOTAL, CKMB, CKMBINDEX, TROPONINI,  in the last 168 hours BNP: BNP (last 3 results) No results found for this basename: PROBNP,  in the last 8760 hours CBG:  Recent Labs Lab 11/27/13 2113  GLUCAP 108*   Signed:  Cheridan Kibler K  [facility] 11/30/2013, 10:52 AM

## 2013-11-30 NOTE — Evaluation (Signed)
Occupational Therapy Evaluation Patient Details Name: Jermaine Lowery MRN: [id number] DOB: [birth year] Today's Date: 11/30/2013    History of Present Illness Ezreal Turay is a 58 y.o. 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   Clinical Impression   Pt admitted with above. Pt independent with ADLs, PTA, feel pt will benefit from acute OT to increase independence prior to d/c. Recommending HHOT upon d/c.    Follow Up Recommendations  Home health OT;Supervision/Assistance - 24 hour    Equipment Recommendations  3 in 1 bedside comode    Recommendations for Other Services       Precautions / Restrictions Precautions Precautions: Fall Precaution Comments: Reports no falls.  [facility] just [facility] with R[facility] on 11/28/13. Restrictions Weight Bearing Restrictions: No      Mobility Bed Mobility Overal bed mobility: Modified Independent                Transfers Overall transfer level: Needs assistance Equipment used: Rolling walker (2 wheeled) Transfers: Sit to/from Stand Sit to Stand: Min guard         General transfer comment: cues for technique.    Balance                                            ADL Overall ADL's : Needs assistance/impaired     Grooming: Supervision/safety;Oral care;Standing;Applying deodorant       Lower Body Bathing: Min guard;Sit to/from stand       Lower Body Dressing: Min guard;Sit to/from stand   Toilet Transfer: Ambulation;RW;Regular Toilet;Grab bars;Minimal assistance (Min A for positioning of walker)       Tub/ Shower Transfer: Minimal assistance;Ambulation;Rolling walker (Assist to postition walker)   Functional mobility during ADLs: Min guard;Rolling  walker General ADL Comments: Told pt to try crossing leg over knee to get sock on as pt was leaning very far back in bed to get one sock on. Ambulated in hallway and worked on finding things on left side. Pt inconsistent in visual field testing, but missed several in left field. Practiced tub transfer and pt Min guard assist. Recommending 3 in 1 to use as shower chair as pt typically sits on side of tub to wash LB. Pt needing cues to position left hand on walker appropriately and also cues to try to stay in middle of walker as he was veering to left side.     Vision                     Perception     Praxis      Pertinent Vitals/Pain No apparent distress.     Hand Dominance Right   Extremity/Trunk Assessment Upper Extremity Assessment Upper Extremity Assessment: LUE deficits/detail LUE Deficits / Details: biceps 4/5; shoulder flexion 4+/5   Lower Extremity Assessment Lower Extremity Assessment: Defer to PT evaluation       Communication Communication Communication: No difficulties   Cognition Arousal/Alertness: Awake/alert Behavior During Therapy: Flat affect Overall Cognitive Status: No family/caregiver present to determine baseline cognitive functioning Area of Impairment: Safety/judgement         Safety/Judgement: Decreased awareness of deficits  General Comments       Exercises       Shoulder Instructions      Home Living Family/patient expects to be discharged to:: Private residence Living Arrangements: Other relatives (sister) Available Help at Discharge: Family;Available PRN/intermittently Type of Home: House Home Access: Stairs to enter [employer] of Steps: 4 Entrance Stairs-Rails: None Home Layout: One level     Bathroom Shower/Tub: Teacher, early years/pre: Standard (sink close)     Home Equipment: Environmental consultant - 2 wheels      Lives With: Family    Prior Functioning/Environment Level of Independence:  Independent             OT Diagnosis: Disturbance of vision;Hemiplegia non-dominant side   OT Problem List: Impaired vision/perception;Impaired balance (sitting and/or standing);Decreased knowledge of use of DME or AE;Decreased knowledge of precautions;Decreased safety awareness   OT Treatment/Interventions: Self-care/ADL training;DME and/or AE instruction;Therapeutic activities;Neuromuscular education;Visual/perceptual remediation/compensation;Patient/family education;Balance training;Cognitive remediation/compensation    OT Goals(Current goals can be found in the care plan section) Acute Rehab OT Goals Patient Stated Goal: not stated OT Goal Formulation: With patient Time For Goal Achievement: 12/07/13 Potential to Achieve Goals: Good ADL Goals Pt Will Perform Lower Body Dressing: with modified independence;sit to/from stand Pt Will Transfer to Toilet: with modified independence;ambulating;grab bars (regular height toilet) Pt Will Perform Toileting - Clothing Manipulation and hygiene: with modified independence;sit to/from stand  OT Frequency: Min 2X/week   Barriers to D/C:            Co-evaluation              End of Session Equipment Utilized During Treatment: Gait belt;Rolling walker  Activity Tolerance: Patient tolerated treatment well Patient left: in bed;with call bell/phone within reach;with bed alarm set   Time: 1610-9604 OT Time Calculation (min): 25 min Charges:  OT General Charges $OT Visit: 1 Procedure OT Evaluation $Initial OT Evaluation Tier I: 1 Procedure OT Treatments $Self Care/Home Management : 8-22 mins G-CodesBenito Mccreedy OTR/L [phone] 11/30/2013, 2:02 PM

## 2013-11-30 NOTE — Progress Notes (Signed)
Spoke with sister regarding patient safety at home.  Sister expressed concerns regarding patient's leaving appliances left open and turning on the stove and leaving it burning.  Talked about needs and concerns beyond the hospital.  Patient had deficits in dealing with his ADLs, basic meal preparations, and safety in the home.  Discussed involvement of social work and case management in helping get the needed agencies in place to get this patient placement in a SNF memory unit.  Waiting for further in formation from social work.

## 2013-12-01 NOTE — Clinical Social Work Note (Signed)
CSW contacted by RN stating pt's family has concern regarding pt returning home at time of discharge. Per chart review, PT/OT recommending home with home health services at time of discharge.  RNCM updated CSW stating [employer] stated pt does not meet requirements for SNF placement at time of discharge. RNCM to address pt's family concerns regarding discharge. [Please see RNCM note] CSW signing off. Thank you for the referral.  Lubertha Sayres, MSW, [facility] Licensed Clinical Social Worker [phone] and [phone]

## 2013-12-01 NOTE — Progress Notes (Signed)
   Patient seen and examined today. Currently he is alert awake and oriented x3. He is able to follow commands and answer all questions appropriately. Cranial nerve testing was intact as tested. From a neurological standpoint, patient is stable for discharge.  Patient was to be discharged by Dr. Wyline Copas on 11/30/2013. Please see full discharge summary dictated by Dr. Wyline Copas.  No changes need to be made at this time.  Spoke with the sister regarding possible placement. It seems that the discharge was held yesterday patient's family had concern regarding patient's ability to be at home alone, wanted patient to be in the memory care unit. Spoke with Inez Catalina, patient's sister, and explained to her that at this time patient is alert and oriented and has no cognitive deficits at this time. Will discharge patient with home health, this will be arranged through case management and the [facility] system.  Total Patient Care time: 20 minutes  Oda Placke D.O. on 12/01/2013 at 12:05 PM  Between 7am to 7pm - Pager - [phone]  After 7pm go to [email or web address] - password [email or web address]  And look for the night coverage person covering for me after hours  [facility] Office  [phone]

## 2013-12-01 NOTE — Progress Notes (Signed)
Physical Therapy Treatment Patient Details Name: Jermaine Lowery MRN: [id number] DOB: 10-May-1955 Today's Date: 12/01/2013    History of Present Illness Jermaine Lowery is a 58 y.o. 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 MRI revealed restricted diffusion within the right frontal lobe, suspicious for acute ischemic infarct.    PT Comments    Pt con't to have delayed processing and difficulty multi-tasking. Pt with impaired higher level balance and is at increased falls risk. For pt to return home safely pt requires 24/7 supervision due to both cognitive and functional deficits. If 24/7 supervision can not be provided pt may need ST-SNF to achieve safe mod I level of function since sister works during the day.   Follow Up Recommendations  Home health PT;Supervision/Assistance - 24 hour     Equipment Recommendations  None recommended by PT    Recommendations for Other Services       Precautions / Restrictions Precautions Precautions: Fall Restrictions Weight Bearing Restrictions: No    Mobility  Bed Mobility Overal bed mobility: Modified Independent             General bed mobility comments: Uses bed rail to move to sitting.  Transfers Overall transfer level: Needs assistance Equipment used: Rolling walker (2 wheeled) Transfers: Sit to/from Stand Sit to Stand: Min guard         General transfer comment: v/c's for safe hand placement  Ambulation/Gait Ambulation/Gait assistance: Min assist Ambulation Distance (Feet): 400 Feet Assistive device: Rolling walker (2 wheeled);None Gait Pattern/deviations: Step-through pattern;Decreased stride length;Narrow base of support Gait velocity: Decreased Gait velocity  interpretation: <1.8 ft/sec, indicative of risk for recurrent falls General Gait Details: pt with improved stabiltiy with RW compared to no AD. pt required minA to regain balance x3 during amb without RW. pt with antalgic L LE limp without RW however pt reports no pain or weakness.   Stairs Stairs: Yes Stairs assistance: Mod assist Stair Management: No rails;Alternating pattern;Forwards Number of Stairs: 4 General stair comments: pt with no rails at home, required HHA and minA for ascending and modA and HHA for ascending  Wheelchair Mobility    Modified Rankin (Stroke Patients Only) Modified Rankin (Stroke Patients Only) Pre-Morbid Rankin Score: Slight disability Modified Rankin: Moderately severe disability     Balance Overall balance assessment: Needs assistance                           High level balance activites: Side stepping;Backward walking (tandem walking) High Level Balance Comments: pt required mod/maxA for tandem ambulation x 20 feet, backwards walking x 20 feet and side stepping. pt with retropulsion with side stepping and unable to side step in a straight line    Cognition Arousal/Alertness: Awake/alert Behavior During Therapy: Flat affect Overall Cognitive Status: No family/caregiver present to determine baseline cognitive functioning Area of Impairment: Memory;Safety/judgement;Awareness;Problem solving;Attention   Current Attention Level: Focused Memory: Decreased short-term memory;Decreased recall of precautions   Safety/Judgement: Decreased awareness of safety;Decreased awareness of deficits Awareness: Emergent Problem Solving: Slow processing;Difficulty sequencing;Requires verbal cues;Requires tactile cues General Comments: pt required freq v/c's to change to task ask, difficulty multi-tasking    Exercises      General Comments        Pertinent Vitals/Pain Denies pain  Home Living                      Prior Function             PT Goals (current goals can now be found in the care plan section) Progress towards PT goals: Progressing toward goals    Frequency  Min 4X/week    PT Plan Current plan remains appropriate    Co-evaluation             End of Session Equipment Utilized During Treatment: Gait belt Activity Tolerance: Patient tolerated treatment well Patient left: in bed;with call bell/phone within reach;with bed alarm set;with nursing/sitter in room     Time: 8366-2947 PT Time Calculation (min): 25 min  Charges:  $Gait Training: 8-22 mins $Neuromuscular Re-education: 8-22 mins                    G Codes:      [name] 12/01/2013, 2:26 PM  Kittie Plater, PT, DPT Pager #: [phone] Office #: [phone]

## 2013-12-01 NOTE — Care Management Note (Addendum)
  Page 2 of 2   12/01/2013     3:24:22 PM CARE MANAGEMENT NOTE 12/01/2013  Patient:  Jermaine Lowery, Jermaine Lowery   Account Number:  [id number]  Date Initiated:  11/28/2013  Documentation initiated by:  Lorne Skeens  Subjective/Objective Assessment:   Patient was admitted with CVA. Follows at the [facility]. Lives at home with sister.     Action/Plan:   Will follow for discharge needs pending PT/OT evals and physician orders.   Anticipated DC Date:     Anticipated DC Plan:  [place]  CM consult      Choice offered to / List presented to:  C-1 Patient        [place] arranged  [place].   Status of service:   Medicare Important Message given?   (If response is "NO", the following Medicare IM given date fields will be blank) Date Medicare IM given:   Medicare IM given by:   Date Additional Medicare IM given:   Additional Medicare IM given by:    Discharge Disposition:    Per UR Regulation:  Reviewed for med. necessity/level of care/duration of stay  If discussed at [place] of Stay Meetings, dates discussed:    Comments:  12/01/13 [place], MSN, CM- CM spoke with Lannette Donath at the [facility] [phone] ext [phone], who has received all faxes regarding home health orders. Orders/clinicals are being forwarded to Dr [name], who will be initiating home health orders.  These orders and authorization will be faxed to [facility] when available.  Mary with [facility] has accepted the referral and has been provided with contact information for [place]. Patient is in agreement with plan of care. Patient's contact number for appointments is [phone].   12/01/13 [place] RN,MSN, CM- Home health orders received. CM and CSW were informed that patient's family was requesting SNF Placement at discharge.  CM spoke with Norina Buzzard at the [facility] regarding this request  and PT/OT notes were faxed per her request.  CM received a call back from Ms Marney Setting stating that patient did not meet criteria for SNF placement.  CM was directed to [place] at [facility], ext [phone] for assistance setting up home health services.  CM was also informed that the [place] will cover adult day care services (pending patient meeting criteria for program) if patient is interested. Awaiting call back from [place], to obtain more details prior to speaking with family regarding discharge disposition.  Dr Ree Kida and CSW were notified.  11/28/13 Lorne Skeens RN, MSN, CM- [facility] contacted [facility] to [facility].  [facility].  Awaiting paperwork from Norina Buzzard at the [facility].

## 2014-02-24 ENCOUNTER — Ambulatory Visit: Payer: Non-veteran care | Attending: Occupational Therapy | Admitting: Occupational Therapy

## 2014-03-02 ENCOUNTER — Ambulatory Visit: Payer: Non-veteran care | Admitting: *Deleted

## 2014-03-03 ENCOUNTER — Emergency Department (HOSPITAL_COMMUNITY): Payer: Non-veteran care

## 2014-03-03 ENCOUNTER — Inpatient Hospital Stay (HOSPITAL_COMMUNITY)
Admission: EM | Admit: 2014-03-03 | Discharge: 2014-03-08 | DRG: 065 | Disposition: A | Discharge status: Skilled Nursing Facility | Payer: Non-veteran care | Attending: Internal Medicine | Admitting: Internal Medicine

## 2014-03-03 ENCOUNTER — Encounter: Payer: Self-pay | Admitting: Occupational Therapy

## 2014-03-03 ENCOUNTER — Encounter (HOSPITAL_COMMUNITY): Payer: Self-pay | Admitting: *Deleted

## 2014-03-03 ENCOUNTER — Ambulatory Visit: Payer: Non-veteran care | Attending: Family Medicine | Admitting: Occupational Therapy

## 2014-03-03 DIAGNOSIS — G8191 Hemiplegia, unspecified affecting right dominant side: Secondary | ICD-10-CM | POA: Diagnosis present

## 2014-03-03 DIAGNOSIS — Z823 Family history of stroke: Secondary | ICD-10-CM | POA: Diagnosis not present

## 2014-03-03 DIAGNOSIS — I6350 Cerebral infarction due to unspecified occlusion or stenosis of unspecified cerebral artery: Secondary | ICD-10-CM

## 2014-03-03 DIAGNOSIS — Z7902 Long term (current) use of antithrombotics/antiplatelets: Secondary | ICD-10-CM

## 2014-03-03 DIAGNOSIS — Z23 Encounter for immunization: Secondary | ICD-10-CM | POA: Diagnosis not present

## 2014-03-03 DIAGNOSIS — Z7982 Long term (current) use of aspirin: Secondary | ICD-10-CM

## 2014-03-03 DIAGNOSIS — R339 Retention of urine, unspecified: Secondary | ICD-10-CM | POA: Diagnosis present

## 2014-03-03 DIAGNOSIS — R310 Gross hematuria: Secondary | ICD-10-CM | POA: Diagnosis not present

## 2014-03-03 DIAGNOSIS — R159 Full incontinence of feces: Secondary | ICD-10-CM | POA: Diagnosis present

## 2014-03-03 DIAGNOSIS — S0990XA Unspecified injury of head, initial encounter: Secondary | ICD-10-CM

## 2014-03-03 DIAGNOSIS — R29898 Other symptoms and signs involving the musculoskeletal system: Secondary | ICD-10-CM

## 2014-03-03 DIAGNOSIS — R296 Repeated falls: Secondary | ICD-10-CM

## 2014-03-03 DIAGNOSIS — R270 Ataxia, unspecified: Secondary | ICD-10-CM | POA: Diagnosis present

## 2014-03-03 DIAGNOSIS — Z8546 Personal history of malignant neoplasm of prostate: Secondary | ICD-10-CM

## 2014-03-03 DIAGNOSIS — Z87891 Personal history of nicotine dependence: Secondary | ICD-10-CM | POA: Diagnosis not present

## 2014-03-03 DIAGNOSIS — R4701 Aphasia: Secondary | ICD-10-CM | POA: Diagnosis present

## 2014-03-03 DIAGNOSIS — Z885 Allergy status to narcotic agent status: Secondary | ICD-10-CM

## 2014-03-03 DIAGNOSIS — Z8249 Family history of ischemic heart disease and other diseases of the circulatory system: Secondary | ICD-10-CM | POA: Diagnosis not present

## 2014-03-03 DIAGNOSIS — Z923 Personal history of irradiation: Secondary | ICD-10-CM | POA: Diagnosis not present

## 2014-03-03 DIAGNOSIS — Z833 Family history of diabetes mellitus: Secondary | ICD-10-CM

## 2014-03-03 DIAGNOSIS — I69354 Hemiplegia and hemiparesis following cerebral infarction affecting left non-dominant side: Secondary | ICD-10-CM

## 2014-03-03 DIAGNOSIS — N39490 Overflow incontinence: Secondary | ICD-10-CM

## 2014-03-03 DIAGNOSIS — E785 Hyperlipidemia, unspecified: Secondary | ICD-10-CM | POA: Diagnosis not present

## 2014-03-03 DIAGNOSIS — I638 Other cerebral infarction: Principal | ICD-10-CM | POA: Diagnosis present

## 2014-03-03 DIAGNOSIS — I639 Cerebral infarction, unspecified: Secondary | ICD-10-CM | POA: Insufficient documentation

## 2014-03-03 DIAGNOSIS — R931 Abnormal findings on diagnostic imaging of heart and coronary circulation: Secondary | ICD-10-CM | POA: Insufficient documentation

## 2014-03-03 DIAGNOSIS — I10 Essential (primary) hypertension: Secondary | ICD-10-CM | POA: Diagnosis present

## 2014-03-03 LAB — CBC WITH DIFFERENTIAL/PLATELET
Basophils Absolute: 0 10*3/uL (ref 0.0–0.1)
Basophils Relative: 1 % (ref 0–1)
EOS PCT: 1 % (ref 0–5)
Eosinophils Absolute: 0.1 10*3/uL (ref 0.0–0.7)
HCT: 42.9 % (ref 39.0–52.0)
Hemoglobin: 14.1 g/dL (ref 13.0–17.0)
LYMPHS ABS: 1.5 10*3/uL (ref 0.7–4.0)
Lymphocytes Relative: 33 % (ref 12–46)
MCH: 27.6 pg (ref 26.0–34.0)
MCHC: 32.9 g/dL (ref 30.0–36.0)
MCV: 84.1 fL (ref 78.0–100.0)
MONO ABS: 0.5 10*3/uL (ref 0.1–1.0)
Monocytes Relative: 11 % (ref 3–12)
Neutro Abs: 2.4 10*3/uL (ref 1.7–7.7)
Neutrophils Relative %: 54 % (ref 43–77)
PLATELETS: 232 10*3/uL (ref 150–400)
RBC: 5.1 MIL/uL (ref 4.22–5.81)
RDW: 14.1 % (ref 11.5–15.5)
WBC: 4.4 10*3/uL (ref 4.0–10.5)

## 2014-03-03 LAB — URINALYSIS, ROUTINE W REFLEX MICROSCOPIC
BILIRUBIN URINE: NEGATIVE
GLUCOSE, UA: NEGATIVE mg/dL
Hgb urine dipstick: NEGATIVE
KETONES UR: NEGATIVE mg/dL
LEUKOCYTES UA: NEGATIVE
Nitrite: NEGATIVE
PROTEIN: NEGATIVE mg/dL
Specific Gravity, Urine: 1.019 (ref 1.005–1.030)
Urobilinogen, UA: 0.2 mg/dL (ref 0.0–1.0)
pH: 6 (ref 5.0–8.0)

## 2014-03-03 LAB — BASIC METABOLIC PANEL
Anion gap: 12 (ref 5–15)
BUN: 17 mg/dL (ref 6–23)
CALCIUM: 9.2 mg/dL (ref 8.4–10.5)
CO2: 26 mEq/L (ref 19–32)
Chloride: 102 mEq/L (ref 96–112)
Creatinine, Ser: 1.09 mg/dL (ref 0.50–1.35)
GFR calc Af Amer: 85 mL/min — ABNORMAL LOW (ref >90)
GFR, EST NON AFRICAN AMERICAN: 74 mL/min — AB (ref >90)
Glucose, Bld: 77 mg/dL (ref 70–99)
Potassium: 4 mEq/L (ref 3.7–5.3)
Sodium: 140 mEq/L (ref 137–147)

## 2014-03-03 MED ORDER — STROKE: EARLY STAGES OF RECOVERY BOOK
Freq: Once | Status: AC
  Administered 2014-03-04: 06:00:00
  Filled 2014-03-03: qty 1

## 2014-03-03 MED ORDER — SERTRALINE HCL 50 MG PO TABS
50.0000 mg | ORAL_TABLET | Freq: Every day | ORAL | Status: DC
  Administered 2014-03-04 – 2014-03-08 (×5): 50 mg via ORAL
  Filled 2014-03-03 (×5): qty 1

## 2014-03-03 MED ORDER — SENNOSIDES-DOCUSATE SODIUM 8.6-50 MG PO TABS: 1.0000 | ORAL_TABLET | Freq: Every evening | ORAL | Status: DC | PRN

## 2014-03-03 MED ORDER — CLOPIDOGREL BISULFATE 75 MG PO TABS
75.0000 mg | ORAL_TABLET | Freq: Every day | ORAL | Status: DC
  Administered 2014-03-04 – 2014-03-05 (×2): 75 mg via ORAL
  Filled 2014-03-03 (×2): qty 1

## 2014-03-03 MED ORDER — ALPRAZOLAM 0.25 MG PO TABS
0.5000 mg | ORAL_TABLET | Freq: Once | ORAL | Status: AC
  Administered 2014-03-03: 0.5 mg via ORAL
  Filled 2014-03-03: qty 2

## 2014-03-03 MED ORDER — ACETAMINOPHEN 325 MG PO TABS: 650.0000 mg | ORAL_TABLET | Freq: Four times a day (QID) | ORAL | Status: DC | PRN

## 2014-03-03 MED ORDER — AMLODIPINE BESYLATE 10 MG PO TABS
10.0000 mg | ORAL_TABLET | Freq: Every day | ORAL | Status: DC
  Administered 2014-03-04 – 2014-03-08 (×5): 10 mg via ORAL
  Filled 2014-03-03 (×5): qty 1

## 2014-03-03 MED ORDER — ENOXAPARIN SODIUM 40 MG/0.4ML ~~LOC~~ SOLN
40.0000 mg | SUBCUTANEOUS | Status: DC
Start: 2014-03-04 — End: 2014-03-06
  Administered 2014-03-04 – 2014-03-06 (×3): 40 mg via SUBCUTANEOUS
  Filled 2014-03-03 (×3): qty 0.4

## 2014-03-03 MED ORDER — ASPIRIN EC 81 MG PO TBEC
81.0000 mg | DELAYED_RELEASE_TABLET | Freq: Every day | ORAL | Status: DC
  Administered 2014-03-04: 81 mg via ORAL
  Filled 2014-03-03: qty 1

## 2014-03-03 MED ORDER — PRAVASTATIN SODIUM 40 MG PO TABS
40.0000 mg | ORAL_TABLET | Freq: Every day | ORAL | Status: DC
  Administered 2014-03-04 – 2014-03-08 (×5): 40 mg via ORAL
  Filled 2014-03-03 (×6): qty 1

## 2014-03-03 NOTE — Therapy (Signed)
Occupational Therapy Evaluation  Patient Details  Name: Jermaine Lowery MRN: [id number] Date of Birth: March 28, 1956  Encounter Date: 03/03/2014      OT End of Session - 03/03/14 0904    Visit Number 1   Number of Visits 18   Date for OT Re-Evaluation 04/30/14   Authorization Type Pt has been approved by [facility] for 18 OT visits through 04/30/14.   Authorization - Visit Number 1   Authorization - Number of Visits 18   OT Start Time 3646   OT Stop Time 0847   OT Time Calculation (min) 52 min   OT Charge Details Eval and 1 treatment   Equipment Utilized During Treatment Red theraputty    Activity Tolerance Patient tolerated treatment well      Past Medical History  Diagnosis Date  . Stroke   . Hypertension   . Prostate cancer     Past Surgical History  Procedure Laterality Date  . Rotator cuff repair    . Prostate cacner      There were no vitals taken for this visit.  Visit Diagnosis:  CVA (cerebral vascular accident) - Plan: Ot plan of care cert/re-cert      Subjective Assessment - 03/03/14 0810    Symptoms Pt has had a history of CVA, TIA in 2013, 2014 and most recent 11/27/13 w/ resultant decreased balance, decreased functional use LUE for ADL's.   Patient Stated Goals Pt/family wishes to improve balance, and UE use on the left.   Currently in Pain? No/denies   Multiple Pain Sites No          [facility] OT Assessment - 03/03/14 0800    Eating/Feeding Minimal assistance   Eating/Feeding Patient Percentage --  Min A for cutting food   Grooming Minimal assistance   Grooming details --  Assistance with shaving (can brush teeth)   Grooming Patient Percentage --  Min  A shaving   Upper Body Bathing: Patient Percentage --  Has walk in shower @ one family/tub shower at another   Upper Body Dressing Patient Percentage 50%   Where Assessed-Upper Body Dressing --  Min A to pull shirt and put arms in   Lower Body Dressing Simulated;Moderate assistance  Pt requires A for  socks, shoes, pants   Toilet Tranfer Moderate assistance  Mod A for clothing negotiation due to LUE impairment   Toileting - Clothing Manipulation Moderate assistance   Toileting -  Hygiene Minimal assistance   Tub/Shower Transfer Moderate assistance   ADL comments Pt requires A for all ADL due to decreased balance and LUE functional use   Sitting Balance --  WFL's   Coordination and Movement Description --  Impaired, unable to oppose   Box and Blocks L = 16 R =29   Coordination Impaired   Overall Strength --  Grip JAMAR L = 19, 20, 19# R = 34, 30, 22   Overall Strength Comments --  Impaired          OT Treatments/Exercises (OP) - 03/03/14 0700    Exercises Hand  Min assist and vc's for positioning/follow through   Fine Motor Coordination Thumb opposition  Grip, lateral and 3 pt pinch LUE red theraputty 10 reps each          Education - 03/03/14 0902    Education provided Yes   Education Details Pt/daughter in law instructed in clinic to perform all exercise with red theraputty 10-15 reps each 1-2x/day. Pt will need further instruction as he was  Min-mod A today in clinic.   Education Details Patient;Child(ren)   Methods Explanation;Demonstration;Handout;Verbal cues;Tactile cues   Comprehension Need further instruction          OT Short Term Goals - 03/03/14 0910    Title Pt will be Mod I HEP for LUE strengthening given min vc's    Time 4   Period Weeks   Status New   Title Pt will be Mod I coordination activities/home program LUE given min vc's   Time 4   Period Weeks   Status New   Title Pt will be Min A UB dressing and LB dressing techniques using LUE as functional assist and a/e PRN   Time 4   Period Weeks   Status New          OT Long Term Goals - 03/03/14 0913    Title Pt will be Mod I self feeding using LUE as functional assist   Time 8   Period Weeks   Status New   Title Pt will improve coordination as evidenced by increased box and blocks  score by 8 or more blocks LUE   Baseline 16 blocks at initial eval   Time 8   Period Weeks   Status New   Title Pt will be supervision level LB/UB dressing using bilateral UEs seated and sit to stand w/ a/e PRN   Baseline Min-mod A at eval   Time 8   Period Weeks   Status New   Title Pt will be supervision level for snack prep w/o LOB   Baseline Min A    Time 8   Period Weeks   Status New          Plan - 03/03/14 0907    Clinical Impression Statement Pt is a 58y/o male w/ h/o TIA and CVA 2013, 2014 and most recent 11/27/13 with LUE weakness, decreased balance, difficulty with ADL's and functional activity.   Pt will benefit from skilled therapeutic intervention in order to improve on the following deficits Decreased activity tolerance;Decreased balance;Decreased coordination;Decreased mobility;Decreased range of motion;Difficulty walking;Decreased strength;Impaired UE functional use   Rehab Potential Good   OT Frequency Min 2X/week   OT Duration 8 weeks   OT Treatment/Interventions Self-care/ADL training;Therapeutic exercise;Neuromuscular education;DME and/or AE instruction;Therapeutic activities;Patient/family education   OT Plan Review HEP, coordination activities and ADL retraining.        Problem List Patient Active Problem List   Diagnosis Date Noted  . Stroke 11/28/2013  . CVA (cerebral infarction) 11/28/2013  . TIA (transient ischemic attack) 02/03/2013  . Renal failure 02/03/2013  . HTN (hypertension) 02/03/2013  . HLD (hyperlipidemia) 02/03/2013                                             Percell Miller Beth Dixon 03/03/2014, 12:48 PM

## 2014-03-03 NOTE — ED Notes (Signed)
2rns attempted catheterizing pt without success. MD aware. Abigail PA attempted Coude catheter without success either. Pt then states that he feels he needs to urinated. Pt voided 237ml.

## 2014-03-03 NOTE — ED Notes (Signed)
Family updated on delay and plan of care

## 2014-03-03 NOTE — Patient Instructions (Signed)
Three Jaw Chuck Pinch Strengthening (Resistive Putty)   Pull putty, using thumb, index and middle fingers. Repeat ____ times. Do ____ sessions per day.  Copyright  VHI. All rights reserved.  Grip Strengthening (Resistive Putty)   Squeeze putty using thumb and all fingers. Repeat ____ times. Do ____ sessions per day.  Copyright  VHI. All rights reserved.  Lateral Pinch Strengthening (Resistive Putty)   Squeeze between thumb and side of each finger in turn. Repeat ____ times. Do ____ sessions per day.  Copyright  VHI. All rights reserved.  Opposition (Active)   Touch tip of thumb to nail tip of each finger in turn, making an "O" shape. Repeat ____ times. Do ____ sessions per day.  Copyright  VHI. All rights reserved.  Three Jaw Chuck Pinch Strengthening (Resistive Putty)   Pull putty, using thumb, index and middle fingers. Repeat _10-15___ times. Do _1-2___ sessions per day.  Copyright  VHI. All rights reserved.  Three Jaw Chuck Pinch Strengthening (Resistive Putty)   Pull putty, using thumb, index and middle fingers. Repeat ____ times. Do ____ sessions per day.  Copyright  VHI. All rights reserved.

## 2014-03-03 NOTE — ED Provider Notes (Signed)
CSN: [id number]     Arrival date & time 03/03/14  4580 History   First MD Initiated Contact with Patient 03/03/14 [phone]     Chief Complaint  Patient presents with  . Urinary Incontinence     (Consider location/radiation/quality/duration/timing/severity/associated sxs/prior Treatment) HPI   Jermaine Lowery Is a 58 year old male brought in by his sister for falls, urinary and bowel incontinence. He has a past medical history of stroke multiple strokes.There is a level V caveat due to patient mental status. He is cared for by his 2 sisters. He has gait disturbance and weakness at baseline with worse weakness on the left. He is followed at the [facility] clinic. His sister states that last weekend 1 week ago the patient fell while using the bathroom at night into the bathtub and was therefore proximally 40 12:55 hour. He did have an abrasion to the back of the head. She states that he was not seen or evaluated for this fall. Over the past week he seems to have had worsening weakness and ataxia. He has been using a walker at home. The patient had 2 episodes of bladder incontinence and one episode of bowel incontinence which are all new. He has not been complaining of back pain. He has a past history of prostate cancer. No noted blood in urine or foul odor. No noted constipation. Patient is not on chronic pain medications.  Past Medical History  Diagnosis Date  . Stroke   . Hypertension   . Prostate cancer    Past Surgical History  Procedure Laterality Date  . Rotator cuff repair    . Prostate cacner     Family History  Problem Relation Age of Onset  . CAD Mother   . Diabetes Mellitus II Mother   . Diabetes Mellitus II Brother   . Stroke Paternal Uncle    History  Substance Use Topics  . Smoking status: Former Research scientist (life sciences)  . Smokeless tobacco: Not on file  . Alcohol Use: No    Review of Systems  Unable to perform ROS     Allergies  Naproxen  Home Medications   Prior to Admission  medications   Medication Sig Start Date End Date Taking? Authorizing Provider  amLODipine (NORVASC) 10 MG tablet Take 10 mg by mouth daily.   Yes Historical Provider, MD  aspirin EC 81 MG tablet Take 81 mg by mouth daily.   Yes Historical Provider, MD  clopidogrel (PLAVIX) 75 MG tablet Take 1 tablet (75 mg total) by mouth daily. 11/30/13  Yes Donne Hazel, MD  pravastatin (PRAVACHOL) 80 MG tablet Take 40 mg by mouth daily.   Yes Historical Provider, MD  sertraline (ZOLOFT) 100 MG tablet Take 50 mg by mouth daily.   Yes Historical Provider, MD   BP 133/86 mmHg  Pulse 69  Temp(Src) 98.7 F (37.1 C) (Oral)  Resp 13  SpO2 97% Physical Exam  Constitutional: He is oriented to person, place, and time. He appears well-nourished. No distress.  HENT:  Head: Normocephalic.  Small well-healing abrasion on the left posterior parietal region without signs of infection. No Hematoma.  Eyes: Conjunctivae and EOM are normal. Pupils are equal, round, and reactive to light.  Neck: Normal range of motion. No JVD present.  Cardiovascular: Normal rate and regular rhythm.   Pulmonary/Chest: Effort normal and breath sounds normal.  Abdominal: Soft. Bowel sounds are normal. He exhibits no distension. There is no tenderness.  Genitourinary: Rectum normal and penis normal.  Digital Rectal Exam reveals  sphincter with good tone. No external hemorrhoids. No masses or fissures. Stool color is brown with no overt blood.   Musculoskeletal: He exhibits no edema or tenderness.  Neurological: He is alert and oriented to person, place, and time. He has normal reflexes. No cranial nerve deficit.  Good strength in proximal leg muscles  Skin: Skin is warm and dry.  Nursing note and vitals reviewed.   ED Course  BLADDER CATHETERIZATION Date/Time: 03/03/2014 6:20 PM Performed by: Margarita Mail Authorized by: Margarita Mail Consent: Verbal consent obtained. Patient identity confirmed: arm band Time out: Immediately  prior to procedure a "time out" was called to verify the correct patient, procedure, equipment, support staff and site/side marked as required. Indications: urine specimen collection Local anesthesia used: no Patient sedated: no Preparation: Patient was prepped and draped in the usual sterile fashion. Catheter insertion: non-indwelling Catheter type: coude Catheter size: 12 Fr Complicated insertion: no Altered anatomy: no Bladder irrigation: no Number of attempts: 2 Urine volume: 300 ml Urine characteristics: clear Patient tolerance: Patient tolerated the procedure well with no immediate complications   (including critical care time) Labs Review Labs Reviewed  BASIC METABOLIC PANEL  CBC WITH DIFFERENTIAL  URINALYSIS, ROUTINE W REFLEX MICROSCOPIC    Imaging Review No results found.   EKG Interpretation   Date/Time:  Tuesday March 03 2014 09:58:14 EST Ventricular Rate:  74 PR Interval:  164 QRS Duration: 92 QT Interval:  397 QTC Calculation: 440 R Axis:   68 Text Interpretation:  Sinus rhythm artifact noted No significant change  since last tracing Confirmed by Christy Gentles  MD, DONALD ([id number]) on 03/03/2014  10:09:57 AM      MDM   Final diagnoses:  Head injuries, initial encounter  Rectal incontinence   10:35 AM BP 133/86 mmHg  Pulse 69  Temp(Src) 98.7 F (37.1 C) (Oral)  Resp 13  SpO2 97% Patient seen in shared visit with Dr. Christy Gentles. ?spinal abnormality, central abnormality, uti, deconditioning? Normal rectal tone and I doubt cauda equina.   1:05 PM Labs and imaging are without acute abnormality. Bladder scan showed 150cc urine. Will cath for UA.   Patient had urine obtained.no acute abnormalities. CT scan is normal. I have spoken with Dr. Nicole Kindred who is on-call for neurology. He requests MRI to rule out acute stroke symptoms. Patient was able to ambulate easily around the emergency department today. There appear to be multiple social issues. His  sisters are taking care of him. He has no disability, no Medicaid. [facility] states that he does not qualify for further services from them as he is not a combat wounded veteran. Esther Hardy, case manager has been in consultation with family since onset of ED evaluation. She has been in touch with [place] is currently pending. Given report to [place] will assume care of the patient for disposition.   Margarita Mail, PA-C 03/03/14 [place], MD 03/05/14 [phone]

## 2014-03-03 NOTE — Care Management (Addendum)
ED CM received referral from [place] concerning patient and family requesting a higher level of care. Patient  Presented to [facility] ED for falls and B/B incontinence. PMH of multiple strokes with deficits. Patient lives at home with sisters who are the  primary caregivers Dan Maker [phone]. Sister feels like patient condition is starting to worsen, the family states, they are no longer able to his care need. He has gait disturbance and weakness at baseline with worse weakness on the left. He is followed at the [place] in [place].  Patient was approved for OP OT but  PT by the [place].  Family care goals are for patient to received a higher level of care. Patient does not meet criteria for SN with [facility]. Contacted Jeani Sow at [facility] at [phone] regarding patient. She states that patient receives Adult Daycare services M/W/Th for 8 hours but, patient does not qualify for SNF. She is requesting to be notified if patient is admitted.  Family has applied for disability and was denied,and has filed an appeal with an attorney.  Medicaid application was filed and is pending as per sister due to the filing of disability. ED evaluation no significant changed noted on head CT,  No fractures noted on X-ray, Brain MRI ordered and pending. CM will continue to follow for DCP.

## 2014-03-03 NOTE — ED Notes (Signed)
Family states that pt had a fall over 1 week ago. Pt had hit the back of his head at that time. Pt is now reported to be incontinent of urine and stool starting yesterday. Pt also has increased confusion and is not as steady on his feet. Pt has hx of stroke. Pt has left sided weakness since stroke in 2012, 2014, 2015.

## 2014-03-03 NOTE — H&P (Signed)
[facility] History and Physical  Patient: Jermaine Lowery  BMW:[id number]  DOB: Mar 31, 1956  DOS: the patient was seen and examined on 03/03/2014 PCP: [name], MD  Chief Complaint: urinary retention and incontinence  HPI: Jermaine Lowery is a 58 y.o. male with Past medical history of CVA, hypertension. The patient presented with complaints of urinary incontinence and CVA with generalized weakness. He has a history of prior stroke with residual left-sided weakness.Marland Kitchen History was up and from the help of sister who friend that since last 3 weeks he has been having progressively difficulty walking around and doing his daily ADLs. He also has been having increasing episodes of retention of urine as well as bowel and followed by incontinence episodes. There is no episode of seizure-like activity. But patient has been having recurrent fall in which he will be standing up and then fall backwards when walking. There is no recent change in his medication but patient is only on aspirin and not on aspirin and Plavix that he was discharged on August. [facility] and has [facility] issues. He uses a walker to walk and there is no significant worsening of his weakness on the left as per the sister as well as patient. No fever no chills no chest pain or shortness of breath and abdominal pain no nausea no vomiting. No speech difficulty and no vision difficulty  The patient is coming from home. And at his baseline independent for most of his ADL.  Review of Systems: as mentioned in the history of present illness.  A Comprehensive review of the other systems is negative.  Past Medical History  Diagnosis Date  . Stroke   . Hypertension   . Prostate cancer    Past Surgical History  Procedure Laterality Date  . Rotator cuff repair    . Prostate cacner     Social History:  reports that he has quit smoking. He does not have any smokeless tobacco history on file. He  reports that he does not drink alcohol or use illicit drugs.  Allergies  Allergen Reactions  . Naproxen Rash    Family History  Problem Relation Age of Onset  . CAD Mother   . Diabetes Mellitus II Mother   . Diabetes Mellitus II Brother   . Stroke Paternal Uncle     Prior to Admission medications   Medication Sig Start Date End Date Taking? Authorizing Provider  amLODipine (NORVASC) 10 MG tablet Take 10 mg by mouth daily.   Yes Historical Provider, MD  aspirin EC 81 MG tablet Take 81 mg by mouth daily.   Yes Historical Provider, MD  clopidogrel (PLAVIX) 75 MG tablet Take 1 tablet (75 mg total) by mouth daily. 11/30/13  Yes Donne Hazel, MD  pravastatin (PRAVACHOL) 80 MG tablet Take 40 mg by mouth daily.   Yes Historical Provider, MD  sertraline (ZOLOFT) 100 MG tablet Take 50 mg by mouth daily.   Yes Historical Provider, MD    Physical Exam: Filed Vitals:   03/03/14 2000 03/03/14 2015 03/03/14 2115 03/03/14 2207  BP: 155/97 154/90 144/93 142/93  Pulse: 67 66 93 102  Temp:      TempSrc:      Resp: 15 16 18 18   SpO2: 98% 96% 95% 98%    General: Alert, Awake and Oriented to Time, Place and Person. Appear in mild distress Eyes: PERRL ENT: Oral Mucosa clear moist. Neck: no JVD Cardiovascular: S1 and S2 Present, aortic systolic Murmur, Peripheral  Pulses Present Respiratory: Bilateral Air entry equal and Decreased, Clear to Auscultation, noCrackles, no wheezes Abdomen: Bowel Sound present, Soft and non tender Skin: no Rash Extremities: no Pedal edema, no calf tenderness Neurologic: Grossly no focal neuro deficit other than mild decrease in strength on left.  Labs on Admission:  CBC:  Recent Labs Lab 03/03/14 1025  WBC 4.4  NEUTROABS 2.4  HGB 14.1  HCT 42.9  MCV 84.1  PLT 232    CMP     Component Value Date/Time   NA 140 03/03/2014 1025   K 4.0 03/03/2014 1025   CL 102 03/03/2014 1025   CO2 26 03/03/2014 1025   GLUCOSE 77 03/03/2014 1025   BUN 17 03/03/2014  1025   CREATININE 1.09 03/03/2014 1025   CALCIUM 9.2 03/03/2014 1025   PROT 7.7 11/27/2013 1802   ALBUMIN 3.9 11/27/2013 1802   AST 25 11/27/2013 1802   ALT 27 11/27/2013 1802   ALKPHOS 117 11/27/2013 1802   BILITOT 0.3 11/27/2013 1802   GFRNONAA 74* 03/03/2014 1025   GFRAA 85* 03/03/2014 1025    No results for input(s): LIPASE, AMYLASE in the last 168 hours. No results for input(s): AMMONIA in the last 168 hours.  No results for input(s): CKTOTAL, CKMB, CKMBINDEX, TROPONINI in the last 168 hours. BNP (last 3 results) No results for input(s): PROBNP in the last 8760 hours.  Radiological Exams on Admission: Dg Thoracic Spine W/swimmers  03/03/2014   CLINICAL DATA:  Status post fall 1 week ago now with onset of incontinence of urine and stool beginning yesterday ; history of previous CVA  EXAM: THORACIC SPINE - 2 VIEW + SWIMMERS  COMPARISON:  PA and lateral chest x-rays of November 28, 2013  FINDINGS: The thoracic vertebral bodies are preserved in height. The intervertebral disc space heights are reasonably well maintained. There is gentle S-shaped thoracolumbar curvature. There are no abnormal paravertebral soft tissue densities. There is a bridging osteophyte on the left at T9-T10.  IMPRESSION: There is no evidence of an acute compression fracture of the thoracic spine nor other acute thoracic spine abnormality.   Electronically Signed   By: David  Martinique   On: 03/03/2014 11:56   Dg Lumbar Spine Complete  03/03/2014   CLINICAL DATA:  Fall 1 week ago with trauma to head. The patient is now in kind at the bowel and bladder.  EXAM: LUMBAR SPINE - COMPLETE 4+ VIEW  COMPARISON:  None.  FINDINGS: Five non rib-bearing lumbar type vertebral bodies are present. Vertebral body heights are normal. There is slight anterolisthesis at L4-5 and L5-S1. Degenerative facet changes are present in the lower lumbar spine. Slight anterolisthesis is noted at L4-5. Alignment is otherwise anatomic. Atherosclerotic  calcifications are present in the aorta and branch vessels without aneurysm.  IMPRESSION: 1. Degenerative changes in the lower lumbar spine, particularly at L4-5 and L5-S1. 2. No acute abnormality. 3. Atherosclerosis.   Electronically Signed   By: Lawrence Santiago M.D.   On: 03/03/2014 11:56   Ct Head Wo Contrast  03/03/2014   CLINICAL DATA:  Head injury related to fall 1 week ago. Initial encounter  EXAM: CT HEAD WITHOUT CONTRAST  TECHNIQUE: Contiguous axial images were obtained from the base of the skull through the vertex without intravenous contrast.  COMPARISON:  02/03/2013  FINDINGS: Skull and Sinuses:Negative for fracture or destructive process. The mastoids, middle ears, and imaged paranasal sinuses are clear.  Orbits: No acute abnormality.  Brain: No evidence of acute abnormality, such as acute  infarction, hemorrhage, hydrocephalus, or mass lesion/mass effect.  Newly visible by CT, but remote, there is a small infarct involving the cortex and subcortical white matter of the high and posterior right frontal lobe. This was noted on brain MRI 11/28/2013. There is extensive remote small vessel ischemic injury to the deep gray nuclei and deep white matter tracts. The pattern is stable from 2014, with infarcts involving the bilateral putamen, bilateral internal capsule, right caudate head, and periventricular white matter. Generalized brain atrophy.  IMPRESSION: 1. No acute intracranial findings. 2. Extensive remote ischemic injury, as described above.   Electronically Signed   By: Jorje Guild M.D.   On: 03/03/2014 11:32   [name] Head Wo Contrast  03/03/2014   CLINICAL DATA:  Bilateral leg weakness.  History of stroke  EXAM: MRI HEAD WITHOUT CONTRAST  MRA HEAD WITHOUT CONTRAST  TECHNIQUE: Multiplanar, multiecho pulse sequences of the brain and surrounding structures were obtained without intravenous contrast. Angiographic images of the head were obtained using MRA technique without contrast.  COMPARISON:   CT 03/03/2014.  MRI 11/28/2013  FINDINGS: MRI HEAD FINDINGS  Image quality degraded by motion. The patient had increasingly difficult time holding still as the study progressed.  Moderate atrophy. Extensive chronic ischemic change. Chronic infarcts are present in the basal ganglia bilaterally, right greater than left. Chronic ischemia throughout the cerebral white matter right greater than left.  Small area of acute infarct in the left parietal white matter measuring 5 x 10 mm. Possible small area of acute infarct in the cingulate gyrus near the corpus callosum in the left frontal lobe. Small area of acute infarct in the left internal capsule.  Improvement in restricted diffusion in a sulcus on the right. This likely is an area of prior hemorrhage.  Negative for mass or edema.  Negative for hydrocephalus.  MRA HEAD FINDINGS  Image quality is significantly degraded by motion.  Left vertebral artery patent to the basilar. Right vertebral artery is severely diseased. Mild stenosis in the basilar. Superior cerebellar and posterior cerebral arteries are patent. Moderate disease in the left posterior cerebral artery .  Internal carotid artery patent bilaterally. Anterior and middle cerebral arteries are poorly visualized due to motion. There appears to be a severe stenosis of the right M1 segment. Mild disease in the left M1 segment. Both anterior cerebral arteries are patent and poorly evaluated on this study.  IMPRESSION: Small areas of acute infarct involving the left parietal white matter, left cingulate gyrus, and left internal capsule.  Advanced chronic ischemic change right greater than left. Prior hemorrhage on the right is noted with improvement.  MRA is severely limited due to motion. There is a probable severe stenosis in the right M1 segment and mild disease in the left M1 segment. Severe disease distal right vertebral artery. Moderate disease in the left posterior cerebral artery.   Electronically Signed    By: Franchot Gallo M.D.   On: 03/03/2014 19:09   Mr Brain Wo Contrast  03/03/2014   CLINICAL DATA:  Bilateral leg weakness.  History of stroke  EXAM: MRI HEAD WITHOUT CONTRAST  MRA HEAD WITHOUT CONTRAST  TECHNIQUE: Multiplanar, multiecho pulse sequences of the brain and surrounding structures were obtained without intravenous contrast. Angiographic images of the head were obtained using MRA technique without contrast.  COMPARISON:  CT 03/03/2014.  MRI 11/28/2013  FINDINGS: MRI HEAD FINDINGS  Image quality degraded by motion. The patient had increasingly difficult time holding still as the study progressed.  Moderate atrophy.  Extensive chronic ischemic change. Chronic infarcts are present in the basal ganglia bilaterally, right greater than left. Chronic ischemia throughout the cerebral white matter right greater than left.  Small area of acute infarct in the left parietal white matter measuring 5 x 10 mm. Possible small area of acute infarct in the cingulate gyrus near the corpus callosum in the left frontal lobe. Small area of acute infarct in the left internal capsule.  Improvement in restricted diffusion in a sulcus on the right. This likely is an area of prior hemorrhage.  Negative for mass or edema.  Negative for hydrocephalus.  MRA HEAD FINDINGS  Image quality is significantly degraded by motion.  Left vertebral artery patent to the basilar. Right vertebral artery is severely diseased. Mild stenosis in the basilar. Superior cerebellar and posterior cerebral arteries are patent. Moderate disease in the left posterior cerebral artery .  Internal carotid artery patent bilaterally. Anterior and middle cerebral arteries are poorly visualized due to motion. There appears to be a severe stenosis of the right M1 segment. Mild disease in the left M1 segment. Both anterior cerebral arteries are patent and poorly evaluated on this study.  IMPRESSION: Small areas of acute infarct involving the left parietal white  matter, left cingulate gyrus, and left internal capsule.  Advanced chronic ischemic change right greater than left. Prior hemorrhage on the right is noted with improvement.  MRA is severely limited due to motion. There is a probable severe stenosis in the right M1 segment and mild disease in the left M1 segment. Severe disease distal right vertebral artery. Moderate disease in the left posterior cerebral artery.   Electronically Signed   By: Franchot Gallo M.D.   On: 03/03/2014 19:09    EKG: Independently reviewed. normal sinus rhythm, nonspecific ST and T waves changes.  Assessment/Plan Principal Problem:   Acute ischemic stroke Active Problems:   HTN (hypertension)   HLD (hyperlipidemia)   1. Acute ischemic stroke The patient is presenting with complaints of urinary retention and episode of incontinence of bowel or bladder. With recurrent fall.due to his recurrent fall scan of the head and neck service of the spine were obtained which were not showing any acute abnormality. Case was discussed with neurology due to his prior history of CVAs and an MRI was obtained which did show an acute infarct. Patient was on aspirin and Plavix in August and currently is only taking aspirin 81 mg. With this medication was discussed with neurology who will be following up with the patient. Antiplatelets per neurology. Patient is out of the computer for any TPA. PTOT and speech consultation. Continue with blood pressure medications at home doses. Continue with Pravachol. Echocardiogram and carotid Doppler. Due to multiple infarcts A she may require further workup to identify the cause of his recurrent strokes.  2.hypertension Blood pressure at present stable continue home medications.  3.urinary incontinence. Etiology currently unclear we will continue to monitor in the hospital urine output. Bowel regimen will also be provided.  4.generalized weakness and recurrent fall. PTOT consultation. At  present no acute fracture identified with continue to monitor.  Advance goals of care discussion: full code   Consults: neurology  DVT Prophylaxis: subcutaneous Heparin Nutrition: cardiac diet  Family Communication: Sister was present at bedside, opportunity was given to ask question and all questions were answered satisfactorily at the time of interview. Disposition: Admitted to inpatient in telemetry unit.  Author: Berle Mull, MD [facility] Pager: [phone] 03/03/2014, 10:23 PM    If  7PM-7AM, please contact night-coverage [email or web address] Password [email or web address]

## 2014-03-03 NOTE — ED Notes (Signed)
Bladder scan- 150ML max of 3 readings.

## 2014-03-03 NOTE — ED Provider Notes (Signed)
Patient seen/examined in the Emergency Department in conjunction with Midlevel Provider [facility] Patient presents for ataxia, increased weakness and reported incontinence over pas week Exam : awake/alert, he has equal power in bilateral LE without any obvious weakness.  He denies any back pain however pt appears confused Plan: imaging/labs ordered at this time   Sharyon Cable, MD 03/03/14 1021

## 2014-03-03 NOTE — ED Notes (Signed)
Bladder scan showed 241ml max

## 2014-03-03 NOTE — ED Notes (Signed)
This RN called MRI and states that they will call apprx 45 minutes prior to sending transport so that pt can receive pre meds

## 2014-03-03 NOTE — ED Notes (Signed)
Social Work called by request of family to discuss home health or placement

## 2014-03-03 NOTE — ED Notes (Signed)
PA at bedside.

## 2014-03-04 DIAGNOSIS — I519 Heart disease, unspecified: Secondary | ICD-10-CM

## 2014-03-04 DIAGNOSIS — I6789 Other cerebrovascular disease: Secondary | ICD-10-CM

## 2014-03-04 LAB — PROTIME-INR
INR: 0.99 (ref 0.00–1.49)
PROTHROMBIN TIME: 13.2 s (ref 11.6–15.2)

## 2014-03-04 LAB — HEMOGLOBIN A1C
Hgb A1c MFr Bld: 5.9 % — ABNORMAL HIGH (ref <5.7)
MEAN PLASMA GLUCOSE: 123 mg/dL — AB (ref <117)

## 2014-03-04 LAB — CBC WITH DIFFERENTIAL/PLATELET
Basophils Absolute: 0 10*3/uL (ref 0.0–0.1)
Basophils Relative: 1 % (ref 0–1)
Eosinophils Absolute: 0.1 10*3/uL (ref 0.0–0.7)
Eosinophils Relative: 3 % (ref 0–5)
HCT: 42.1 % (ref 39.0–52.0)
HEMOGLOBIN: 14 g/dL (ref 13.0–17.0)
LYMPHS ABS: 1.5 10*3/uL (ref 0.7–4.0)
Lymphocytes Relative: 36 % (ref 12–46)
MCH: 27.9 pg (ref 26.0–34.0)
MCHC: 33.3 g/dL (ref 30.0–36.0)
MCV: 83.9 fL (ref 78.0–100.0)
Monocytes Absolute: 0.5 10*3/uL (ref 0.1–1.0)
Monocytes Relative: 12 % (ref 3–12)
NEUTROS PCT: 48 % (ref 43–77)
Neutro Abs: 2.1 10*3/uL (ref 1.7–7.7)
PLATELETS: 238 10*3/uL (ref 150–400)
RBC: 5.02 MIL/uL (ref 4.22–5.81)
RDW: 14.2 % (ref 11.5–15.5)
WBC: 4.3 10*3/uL (ref 4.0–10.5)

## 2014-03-04 LAB — LIPID PANEL
CHOLESTEROL: 137 mg/dL (ref 0–200)
HDL: 50 mg/dL (ref >39)
LDL Cholesterol: 58 mg/dL (ref 0–99)
Total CHOL/HDL Ratio: 2.7 RATIO
Triglycerides: 146 mg/dL (ref <150)
VLDL: 29 mg/dL (ref 0–40)

## 2014-03-04 LAB — COMPREHENSIVE METABOLIC PANEL
ALBUMIN: 3.4 g/dL — AB (ref 3.5–5.2)
ALK PHOS: 107 U/L (ref 39–117)
ALT: 57 U/L — ABNORMAL HIGH (ref 0–53)
AST: 57 U/L — AB (ref 0–37)
Anion gap: 13 (ref 5–15)
BUN: 13 mg/dL (ref 6–23)
CALCIUM: 9.4 mg/dL (ref 8.4–10.5)
CO2: 23 mEq/L (ref 19–32)
Chloride: 101 mEq/L (ref 96–112)
Creatinine, Ser: 1.03 mg/dL (ref 0.50–1.35)
GFR calc Af Amer: >90 mL/min (ref >90)
GFR calc non Af Amer: 79 mL/min — ABNORMAL LOW (ref >90)
Glucose, Bld: 80 mg/dL (ref 70–99)
Potassium: 4.3 mEq/L (ref 3.7–5.3)
Sodium: 137 mEq/L (ref 137–147)
TOTAL PROTEIN: 7 g/dL (ref 6.0–8.3)
Total Bilirubin: 0.2 mg/dL — ABNORMAL LOW (ref 0.3–1.2)

## 2014-03-04 LAB — C-REACTIVE PROTEIN

## 2014-03-04 LAB — SEDIMENTATION RATE: Sed Rate: 14 mm/hr (ref 0–16)

## 2014-03-04 MED ORDER — ASPIRIN EC 325 MG PO TBEC
325.0000 mg | DELAYED_RELEASE_TABLET | Freq: Every day | ORAL | Status: DC
  Administered 2014-03-04 – 2014-03-05 (×2): 325 mg via ORAL
  Filled 2014-03-04 (×2): qty 1

## 2014-03-04 MED ORDER — TAMSULOSIN HCL 0.4 MG PO CAPS
0.4000 mg | ORAL_CAPSULE | Freq: Every day | ORAL | Status: DC
Start: 2014-03-04 — End: 2014-03-08
  Administered 2014-03-04 – 2014-03-07 (×5): 0.4 mg via ORAL
  Filled 2014-03-04 (×5): qty 1

## 2014-03-04 MED ORDER — INFLUENZA VAC SPLIT QUAD 0.5 ML IM SUSY
0.5000 mL | PREFILLED_SYRINGE | INTRAMUSCULAR | Status: AC
  Administered 2014-03-05: 0.5 mL via INTRAMUSCULAR
  Filled 2014-03-04: qty 0.5

## 2014-03-04 MED ORDER — TAMSULOSIN HCL 0.4 MG PO CAPS: 0.4000 mg | ORAL_CAPSULE | Freq: Every day | ORAL | Status: DC

## 2014-03-04 NOTE — Consult Note (Signed)
Neurology Consultation Reason for Consult: Strokes Referring Physician: Marlowe Sax  CC: strokes  History is obtained from:patient, medical record  HPI: Jermaine Lowery is a 58 y.o. male with a history of gait problems and previous strokes. He had incontinence yesterday and his sister is worried about them and therefore brought him to the emergency room. He states that he feels his walking is about the same as it has been, but after going to the bathroom he was unable to get his pants open and hisunder were down prior to needing to go. He therefore wet himself.  He states that he does not feel that his symptoms are significantly worse.    ROS: A 14 point ROS was performed and is negative except as noted in the HPI.   Past Medical History  Diagnosis Date  . Stroke   . Hypertension   . Prostate cancer     Family History: uncle-stroke  Social History: Tob: former smoker  Exam: Current vital signs: BP 142/93 mmHg  Pulse 102  Temp(Src) 98.7 F (37.1 C) (Oral)  Resp 18  SpO2 98% Vital signs in last 24 hours: Temp:  [98.7 F (37.1 C)] 98.7 F (37.1 C) (11/03 0953) Pulse Rate:  [26-102] 102 (11/03 2207) Resp:  [12-18] 18 (11/03 2207) BP: (129-163)/(78-102) 142/93 mmHg (11/03 2207) SpO2:  [95 %-100 %] 98 % (11/03 2207)  General: in bed, NAD CV: regular rate and rhythm Mental Status: Patient is awake, alert, oriented to person, place, month, year, and situation. Immediate and remote memory are intact. Patient is able to give a clear and coherent history. No signs of aphasia or neglect Cranial Nerves: II: Visual Fields are full. Pupils are equal, round, and reactive to light.  Discs are difficult to visualize. III,IV, VI: EOMI without ptosis or diploplia.  V: Facial sensation is symmetric to temperature VII: Facial movement is symmetric.  VIII: hearing is intact to voice X: Uvula elevates symmetrically XI: Shoulder shrug is symmetric. XII: tongue is midline without  atrophy or fasciculations.  Motor: Tone is normal. Bulk is normal. 5/5 strength was present in all four extremities. He has an intentional and resting tremor on the right.  Sensory: Sensation is symmetric to light touch and temperature in the arms and legs. Deep Tendon Reflexes: 2+ and symmetric in the biceps and patellae.  Plantars: Toes are downgoing bilaterally.  Cerebellar: FNF slow bilaterally Gait: Not tested 2/2 patient safety concerns.    I have reviewed labs in epic and the results pertinent to this consultation are: BMP, CBC-unremarkable  I have reviewed the images obtained:MRI brain-multiple small infarcts  Impression: 58 year old male with history of multiple infarcts. It is not clear to me if he really does have worsening symptoms, but the MRI does demonstrate clearly acute infarcts.  Recommendations: 1. HgbA1c, fasting lipid panel 2. Frequent neuro checks 3. Echocardiogram 4. Carotid dopplers 5. Prophylactic therapy-Antiplatelet med: Aspirin - dose 325mg  PO or 300mg  PR 6. Risk factor modification 7. Telemetry monitoring 8. PT consult, OT consult, Speech consult    Roland Rack, MD [facility] [phone]  If 7pm- 7am, please page neurology on call as listed in [place].

## 2014-03-04 NOTE — Progress Notes (Signed)
Occupational Therapy Evaluation Patient Details Name: Jermaine Lowery MRN: [id number] DOB: Oct 18, 1955 Today's Date: 03/04/2014    History of Present Illness 58 y.o. male admitted on 03/03/14 after recent falls, urinary and bowel retention, and incontinence. MRI showed small infarcts L parietal, L frontal, and L internal capsule. 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 Pt currently with functional limitiations due to the deficits listed below (see OT problem list). Pt demonstrating delayed responses to questions and commands. Pt may not be an accurate historian as he reported to therapist using RW at baseline, but denied this to MD. Pt will benefit from skilled OT to increase their independence and safety with adls and balance to allow discharge to CIR.     Follow Up Recommendations  CIR    Equipment Recommendations  Tub/shower seat;3 in 1 bedside comode    Recommendations for Other Services Rehab consult     Precautions / Restrictions Precautions Precautions: Fall Precaution Comments: reports he has fallen twice in last few months (?accuracy) Restrictions Weight Bearing Restrictions: No      Mobility Bed Mobility Overal bed mobility: Needs Assistance Bed Mobility: Supine to Sit     Supine to sit: Mod assist     General bed mobility comments: HOB 0, no bedrail use. Pt reports exiting bed on R side at home. Pt falling posteriorly while attempting to sit EOB. Pt did not respond to command to grab onto bedrail and did not grab onto therapist's hand when instructed to do so. Mod (A) for trunk support to maintain upright position.  Transfers Overall transfer level: Needs assistance Equipment used: Rolling walker (2 wheeled) Transfers: Sit to/from Stand Sit to Stand: Min assist         General transfer  comment: Min (A) to stabilize balance upon standing. Verbal cues for safe RW use and management.    Balance Overall balance assessment: Needs assistance Sitting-balance support: Bilateral upper extremity supported;Feet supported Sitting balance-Leahy Scale: Poor Sitting balance - Comments: posterior and R lean Postural control: Posterior lean Standing balance support: Bilateral upper extremity supported;During functional activity Standing balance-Leahy Scale: Poor Standing balance comment: Pt pulling RW off floor due to posterior lean.                            ADL Overall ADL's : Needs assistance/impaired     Grooming: Wash/dry hands;Min guard;Cueing for sequencing;Standing Grooming Details (indicate cue type and reason): Min guard (A) for safety/balance. Pt demonstrating posterior lean while standing at sink. Pt aware of leaning but unable to correct without tactile cues. Verbal cues to shut off water.      Lower Body Bathing: Min guard;Sitting/lateral leans       Lower Body Dressing: Moderate assistance;Sitting/lateral leans;Sit to/from stand Lower Body Dressing Details (indicate cue type and reason): Mod (A) to thread briefs, don socks halfway, and to tie shoes. Pt stood to pull up briefs and needed verbal cues to finish pulling up right side. Pt able to finish donning socks over heel and put on shoes. Toilet Transfer: Minimal assistance;Ambulation;Regular Toilet;Grab bars;RW Armed forces technical officer Details (indicate cue type and reason): Min (A) for controlled descend to toilet as pt was beginning to urinate on floor (incontinent).   Toileting- Clothing Manipulation and Hygiene: Minimal assistance;Cueing for safety;Sit to/from stand Toileting -  Clothing Manipulation Details (indicate cue type and reason): Min (A) to move gown as pt was beginning to urinate on floor and was unable to fully remove it.     Functional mobility during ADLs: Minimal assistance;Rolling  walker General ADL Comments: Pt demonstrating posterior lean during all adls assessed. Pt had LOB x2 while ambulating to and from bathroom. Pt also ambulating rocked back on heels and toes elevated. Pt required verbal cues for safe RW use and management and for safe hand placement. On return to chair, pt pushed RW aside and attempted to walk 3-4 steps without it. Pt cued to bring RW with him, but pt unable to due to slow processing of commands and delayed responses.     Vision                     Perception     Praxis      Pertinent Vitals/Pain Pain Assessment: No/denies pain     Hand Dominance Right   Extremity/Trunk Assessment Upper Extremity Assessment Upper Extremity Assessment: Overall WFL for tasks assessed   Lower Extremity Assessment Lower Extremity Assessment: Defer to PT evaluation   Cervical / Trunk Assessment Cervical / Trunk Assessment: Kyphotic   Communication Communication Communication: Expressive difficulties (Delayed responses)   Cognition Arousal/Alertness: Awake/alert Behavior During Therapy: Flat affect Overall Cognitive Status: No family/caregiver present to determine baseline cognitive functioning                     General Comments       Exercises       Shoulder Instructions      Home Living Family/patient expects to be discharged to:: Private residence Living Arrangements: Other relatives Available Help at Discharge: Family;Available PRN/intermittently Type of Home: House Home Access: Stairs to enter [employer] of Steps: 4 Entrance Stairs-Rails: None Home Layout: One level     Bathroom Shower/Tub: Tub/shower unit Shower/tub characteristics: Architectural technologist: Standard     Home Equipment: Environmental consultant - 2 wheels;Hand held shower head          Prior Functioning/Environment Level of Independence: Needs assistance  Gait / Transfers Assistance Needed: Walks with walker. Pt reported to MD that he does  not use RW (?accuracy).          OT Diagnosis: Generalized weakness;Cognitive deficits;Disturbance of vision;Hemiplegia dominant side   OT Problem List: Decreased strength;Decreased range of motion;Decreased activity tolerance;Impaired balance (sitting and/or standing);Impaired vision/perception;Decreased coordination;Decreased cognition;Decreased safety awareness;Decreased knowledge of use of DME or AE;Decreased knowledge of precautions   OT Treatment/Interventions: Self-care/ADL training;Therapeutic exercise;DME and/or AE instruction;Neuromuscular education;Therapeutic activities;Visual/perceptual remediation/compensation;Patient/family education;Balance training    OT Goals(Current goals can be found in the care plan section) Acute Rehab OT Goals Patient Stated Goal: likes to watch TV "football"- wants 49ers to win OT Goal Formulation: With patient Time For Goal Achievement: 03/18/14 Potential to Achieve Goals: Good ADL Goals Pt Will Perform Grooming: with supervision;standing Pt Will Perform Upper Body Bathing: with set-up;sitting Pt Will Perform Lower Body Bathing: with supervision;sit to/from stand Pt Will Transfer to Toilet: with supervision;ambulating;regular height toilet Pt Will Perform Toileting - Clothing Manipulation and hygiene: with supervision;sit to/from stand  OT Frequency: Min 2X/week   Barriers to D/C:            Co-evaluation              End of Session Equipment Utilized During Treatment: Gait belt;Rolling walker Nurse Communication: Mobility status;Precautions  Activity Tolerance: Patient tolerated treatment well Patient  left: in chair;with call bell/phone within reach;with chair alarm set   Time: 1130-1155 OT Time Calculation (min): 25 min Charges:    G-Codes:    Redmond Baseman Mar 31, 2014, 1:18 PM

## 2014-03-04 NOTE — Progress Notes (Signed)
*  PRELIMINARY RESULTS* Echocardiogram 2D Echocardiogram has been performed.  Leavy Cella 03/04/2014, 11:07 AM

## 2014-03-04 NOTE — Progress Notes (Signed)
Clarified with PA about d/c'd CBG order. He is aware that patient is on peg tube feeding with Pivot 1.5 Cal.

## 2014-03-04 NOTE — Evaluation (Signed)
Speech Language Pathology Evaluation Patient Details Name: Jermaine Lowery MRN: [id number] DOB: May 26, 1955 Today's Date: 03/04/2014 Time: 6222-9798 SLP Time Calculation (min): 24 min  Problem List:  Patient Active Problem List   Diagnosis Date Noted  . Acute ischemic stroke 03/03/2014  . Acute CVA (cerebrovascular accident) 03/03/2014  . Stroke 11/28/2013  . CVA (cerebral infarction) 11/28/2013  . TIA (transient ischemic attack) 02/03/2013  . Renal failure 02/03/2013  . HTN (hypertension) 02/03/2013  . HLD (hyperlipidemia) 02/03/2013   Past Medical History:  Past Medical History  Diagnosis Date  . Stroke   . Hypertension   . Prostate cancer    Past Surgical History:  Past Surgical History  Procedure Laterality Date  . Rotator cuff repair    . Prostate cacner     HPI:  58 y.o. male admitted on 03/03/14 after recent falls, urinary and bowel retention, and incontinence. MRI showed small infarcts L parietal, L frontal, and L internal capsule. 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 He currently requires Mod cues from SLP, including frequent repetitions of information. Basic, functional problem solving appears to be a relative strength. He does well with basic confrontational and responsive naming tasks, although spontaneous communication is limited to short phrases and he requires Max cues for more complex linguistic tasks. Patient's insight and awareness is limited, as he is unsure what deficits may have been present PTA and which ones may be from acute CVA. Pt will benefit from SLP services targeting the above in order to maximize cognitive-linguistic skills and facilitate transition to CIR.    SLP Assessment  Patient needs continued  Speech Lanaguage Pathology Services    Follow Up Recommendations  Inpatient Rehab;24 hour supervision/assistance    Frequency and Duration min 2x/week  2 weeks   Pertinent Vitals/Pain Pain Assessment: No/denies pain   SLP Goals  Patient/Family Stated Goal: none stated by patient, no family present Potential to Achieve Goals: Fair Potential Considerations: Previous level of function (PLOF unclear)  SLP Evaluation Prior Functioning  Cognitive/Linguistic Baseline: Information not available Type of Home: House  Lives With: Family (sister) Available Help at Discharge: Family;Available PRN/intermittently (lives with sister who works outside the home)   Cognition  Overall Cognitive Status: No family/caregiver present to determine baseline cognitive functioning Arousal/Alertness: Awake/alert Orientation Level: Oriented to person;Oriented to place;Oriented to situation;Disoriented to time Attention: Sustained Sustained Attention: Appears intact Memory: Impaired Memory Impairment: Retrieval deficit;Decreased recall of new information Awareness: Impaired Awareness Impairment: Intellectual impairment;Emergent impairment;Anticipatory impairment Problem Solving: Impaired Problem Solving Impairment: Verbal basic Executive Function: Reasoning Reasoning: Impaired Reasoning Impairment: Verbal basic Safety/Judgment: Impaired    Comprehension  Auditory Comprehension Overall Auditory Comprehension: Impaired Yes/No Questions: Impaired Complex Questions: 75-100% accurate Paragraph Comprehension (via yes/no questions): 51-75% accurate Commands: Impaired One Step Basic Commands: 75-100% accurate Two Step Basic Commands: 75-100% accurate Multistep Basic Commands: 75-100% accurate Complex Commands: 25-49% accurate Conversation: Simple Interfering Components: Processing speed;Working Field seismologist: Astronomer: Within [employer] Reading Comprehension Reading Status: Not tested    Expression Expression Primary Mode of Expression: Verbal Verbal Expression Overall Verbal Expression: Impaired Automatic Speech: Name Level of Generative/Spontaneous Verbalization: Phrase Naming: Impairment Responsive: 76-100% accurate Confrontation: Within functional limits Divergent: 25-49% accurate Pragmatics: Impairment Impairments: Abnormal affect;Eye contact;Monotone Non-Verbal Means of Communication: Not applicable  Written Expression Written Expression: Not tested   Oral / Motor Motor Speech Overall Motor Speech: Appears within functional limits for tasks assessed   GO      Germain Osgood, M.A. CCC-SLP [phone]  Germain Osgood 03/04/2014, 3:46 PM

## 2014-03-04 NOTE — Progress Notes (Signed)
CARE MANAGEMENT NOTE 03/04/2014  Patient:  Jermaine Lowery, Jermaine Lowery   Account Number:  [id number]  Date Initiated:  03/04/2014  Documentation initiated by:  Olga Coaster  Subjective/Objective Assessment:   ADMITTED WITH STROKE     Action/Plan:   CM FOLLOWING FOR DCP   Anticipated DC Date:  03/07/2014   Anticipated DC Plan:  AWAITING FOR PT/OT EVALS FOR DISPOSITION NEEDS     DC Planning Services  CM consult         Status of service:  In process, will continue to follow Medicare Important Message given?   (If response is "NO", the following Medicare IM given date fields will be blank)  Per UR Regulation:  Reviewed for med. necessity/level of care/duration of stay  Comments:  11/4/2015Mindi Slicker RN,BSN,MHA [phone]

## 2014-03-04 NOTE — Progress Notes (Signed)
Inpatient Rehabilitation  Note that PT is recommending CIR.  Pt.'s VA benefits will not give authorization for IP rehab admission, however if you wish to proceed with consult, please order.  Thank you.  [facility] Admissions Coordinator Cell [phone] Office [phone]

## 2014-03-04 NOTE — Plan of Care (Signed)
Problem: Consults Goal: Ischemic Stroke Patient Education See Patient Education Module for education specifics. Outcome: Completed/Met Date Met:  03/04/14  Problem: Acute Treatment Outcomes Goal: Neuro exam at baseline or improved Outcome: Completed/Met Date Met:  03/04/14 Goal: BP within ordered parameters Outcome: Completed/Met Date Met:  03/04/14 Goal: Airway maintained/protected Outcome: Completed/Met Date Met:  03/04/14 Goal: 02 Sats > 94% Outcome: Completed/Met Date Met:  03/04/14 Goal: Hemodynamically stable Outcome: Completed/Met Date Met:  03/04/14 Goal: Prognosis discussed with family/patient as appropriate Outcome: Completed/Met Date Met:  03/04/14 Goal: Other Acute Treatment Outcomes Outcome: Completed/Met Date Met:  03/04/14

## 2014-03-04 NOTE — Progress Notes (Signed)
STROKE TEAM PROGRESS NOTE   HISTORY Jermaine Lowery is a 58 y.o. male with a history of gait problems and previous strokes. He had incontinence yesterday and his sister is worried about them and therefore brought him to the emergency room. He states that he feels his walking is about the same as it has been, but after going to the bathroom he was unable to get his pants open and hisunder were down prior to needing to go. He therefore wet himself. He states that he does not feel that his symptoms are significantly worse. He was last known well 03/03/2014. Time was not documented. Patient was not administered TPA secondary to delay in arrival. He was admitted for further evaluation and treatment.   SUBJECTIVE (INTERVAL HISTORY) His therapist is at the bedside.  Overall he feels his condition is unchanged.    OBJECTIVE Temp:  [97.9 F (36.6 C)-99 F (37.2 C)] 97.9 F (36.6 C) (11/04 1000) Pulse Rate:  [63-102] 90 (11/04 1000) Cardiac Rhythm:  [-] Normal sinus rhythm (11/03 2207) Resp:  [12-18] 18 (11/04 1000) BP: (126-163)/(78-103) 152/88 mmHg (11/04 1000) SpO2:  [95 %-100 %] 96 % (11/04 1000) Weight:  [71.487 kg (157 lb 9.6 oz)] 71.487 kg (157 lb 9.6 oz) (11/04 1000)  No results for input(s): GLUCAP in the last 168 hours.  Recent Labs Lab 03/03/14 1025 03/04/14 0449  NA 140 137  K 4.0 4.3  CL 102 101  CO2 26 23  GLUCOSE 77 80  BUN 17 13  CREATININE 1.09 1.03  CALCIUM 9.2 9.4    Recent Labs Lab 03/04/14 0449  AST 57*  ALT 57*  ALKPHOS 107  BILITOT 0.2*  PROT 7.0  ALBUMIN 3.4*    Recent Labs Lab 03/03/14 1025 03/04/14 0618  WBC 4.4 4.3  NEUTROABS 2.4 2.1  HGB 14.1 14.0  HCT 42.9 42.1  MCV 84.1 83.9  PLT 232 238   No results for input(s): CKTOTAL, CKMB, CKMBINDEX, TROPONINI in the last 168 hours.  Recent Labs  03/04/14 0618  LABPROT 13.2  INR 0.99    Recent Labs  03/03/14 1515  COLORURINE YELLOW  LABSPEC 1.019  PHURINE 6.0  GLUCOSEU NEGATIVE   HGBUR NEGATIVE  BILIRUBINUR NEGATIVE  KETONESUR NEGATIVE  PROTEINUR NEGATIVE  UROBILINOGEN 0.2  NITRITE NEGATIVE  LEUKOCYTESUR NEGATIVE       Component Value Date/Time   CHOL 137 03/04/2014 0449   TRIG 146 03/04/2014 0449   HDL 50 03/04/2014 0449   CHOLHDL 2.7 03/04/2014 0449   VLDL 29 03/04/2014 0449   LDLCALC 58 03/04/2014 0449   Lab Results  Component Value Date   HGBA1C 6.0* 11/27/2013      Component Value Date/Time   LABOPIA NONE DETECTED 02/04/2013 1556   COCAINSCRNUR NONE DETECTED 02/04/2013 1556   LABBENZ NONE DETECTED 02/04/2013 1556   AMPHETMU NONE DETECTED 02/04/2013 1556   THCU NONE DETECTED 02/04/2013 1556   LABBARB NONE DETECTED 02/04/2013 1556    No results for input(s): ETH in the last 168 hours.  Dg Thoracic Spine W/swimmers  03/03/2014   CLINICAL DATA:  Status post fall 1 week ago now with onset of incontinence of urine and stool beginning yesterday ; history of previous CVA  EXAM: THORACIC SPINE - 2 VIEW + SWIMMERS  COMPARISON:  PA and lateral chest x-rays of November 28, 2013  FINDINGS: The thoracic vertebral bodies are preserved in height. The intervertebral disc space heights are reasonably well maintained. There is gentle S-shaped thoracolumbar curvature. There are no  abnormal paravertebral soft tissue densities. There is a bridging osteophyte on the left at T9-T10.  IMPRESSION: There is no evidence of an acute compression fracture of the thoracic spine nor other acute thoracic spine abnormality.   Electronically Signed   By: David  Martinique   On: 03/03/2014 11:56   Dg Lumbar Spine Complete  03/03/2014   CLINICAL DATA:  Fall 1 week ago with trauma to head. The patient is now in kind at the bowel and bladder.  EXAM: LUMBAR SPINE - COMPLETE 4+ VIEW  COMPARISON:  None.  FINDINGS: Five non rib-bearing lumbar type vertebral bodies are present. Vertebral body heights are normal. There is slight anterolisthesis at L4-5 and L5-S1. Degenerative facet changes are present  in the lower lumbar spine. Slight anterolisthesis is noted at L4-5. Alignment is otherwise anatomic. Atherosclerotic calcifications are present in the aorta and branch vessels without aneurysm.  IMPRESSION: 1. Degenerative changes in the lower lumbar spine, particularly at L4-5 and L5-S1. 2. No acute abnormality. 3. Atherosclerosis.   Electronically Signed   By: Lawrence Santiago M.D.   On: 03/03/2014 11:56   Ct Head Wo Contrast  03/03/2014   CLINICAL DATA:  Head injury related to fall 1 week ago. Initial encounter  EXAM: CT HEAD WITHOUT CONTRAST  TECHNIQUE: Contiguous axial images were obtained from the base of the skull through the vertex without intravenous contrast.  COMPARISON:  02/03/2013  FINDINGS: Skull and Sinuses:Negative for fracture or destructive process. The mastoids, middle ears, and imaged paranasal sinuses are clear.  Orbits: No acute abnormality.  Brain: No evidence of acute abnormality, such as acute infarction, hemorrhage, hydrocephalus, or mass lesion/mass effect.  Newly visible by CT, but remote, there is a small infarct involving the cortex and subcortical white matter of the high and posterior right frontal lobe. This was noted on brain MRI 11/28/2013. There is extensive remote small vessel ischemic injury to the deep gray nuclei and deep white matter tracts. The pattern is stable from 2014, with infarcts involving the bilateral putamen, bilateral internal capsule, right caudate head, and periventricular white matter. Generalized brain atrophy.  IMPRESSION: 1. No acute intracranial findings. 2. Extensive remote ischemic injury, as described above.   Electronically Signed   By: Jorje Guild M.D.   On: 03/03/2014 11:32   [name] Head Wo Contrast  03/03/2014   CLINICAL DATA:  Bilateral leg weakness.  History of stroke  EXAM: MRI HEAD WITHOUT CONTRAST  MRA HEAD WITHOUT CONTRAST  TECHNIQUE: Multiplanar, multiecho pulse sequences of the brain and surrounding structures were obtained without  intravenous contrast. Angiographic images of the head were obtained using MRA technique without contrast.  COMPARISON:  CT 03/03/2014.  MRI 11/28/2013  FINDINGS: MRI HEAD FINDINGS  Image quality degraded by motion. The patient had increasingly difficult time holding still as the study progressed.  Moderate atrophy. Extensive chronic ischemic change. Chronic infarcts are present in the basal ganglia bilaterally, right greater than left. Chronic ischemia throughout the cerebral white matter right greater than left.  Small area of acute infarct in the left parietal white matter measuring 5 x 10 mm. Possible small area of acute infarct in the cingulate gyrus near the corpus callosum in the left frontal lobe. Small area of acute infarct in the left internal capsule.  Improvement in restricted diffusion in a sulcus on the right. This likely is an area of prior hemorrhage.  Negative for mass or edema.  Negative for hydrocephalus.  MRA HEAD FINDINGS  Image quality is significantly degraded  by motion.  Left vertebral artery patent to the basilar. Right vertebral artery is severely diseased. Mild stenosis in the basilar. Superior cerebellar and posterior cerebral arteries are patent. Moderate disease in the left posterior cerebral artery .  Internal carotid artery patent bilaterally. Anterior and middle cerebral arteries are poorly visualized due to motion. There appears to be a severe stenosis of the right M1 segment. Mild disease in the left M1 segment. Both anterior cerebral arteries are patent and poorly evaluated on this study.  IMPRESSION: Small areas of acute infarct involving the left parietal white matter, left cingulate gyrus, and left internal capsule.  Advanced chronic ischemic change right greater than left. Prior hemorrhage on the right is noted with improvement.  MRA is severely limited due to motion. There is a probable severe stenosis in the right M1 segment and mild disease in the left M1 segment. Severe  disease distal right vertebral artery. Moderate disease in the left posterior cerebral artery.   Electronically Signed   By: Franchot Gallo M.D.   On: 03/03/2014 19:09   Mr Brain Wo Contrast  03/03/2014   CLINICAL DATA:  Bilateral leg weakness.  History of stroke  EXAM: MRI HEAD WITHOUT CONTRAST  MRA HEAD WITHOUT CONTRAST  TECHNIQUE: Multiplanar, multiecho pulse sequences of the brain and surrounding structures were obtained without intravenous contrast. Angiographic images of the head were obtained using MRA technique without contrast.  COMPARISON:  CT 03/03/2014.  MRI 11/28/2013  FINDINGS: MRI HEAD FINDINGS  Image quality degraded by motion. The patient had increasingly difficult time holding still as the study progressed.  Moderate atrophy. Extensive chronic ischemic change. Chronic infarcts are present in the basal ganglia bilaterally, right greater than left. Chronic ischemia throughout the cerebral white matter right greater than left.  Small area of acute infarct in the left parietal white matter measuring 5 x 10 mm. Possible small area of acute infarct in the cingulate gyrus near the corpus callosum in the left frontal lobe. Small area of acute infarct in the left internal capsule.  Improvement in restricted diffusion in a sulcus on the right. This likely is an area of prior hemorrhage.  Negative for mass or edema.  Negative for hydrocephalus.  MRA HEAD FINDINGS  Image quality is significantly degraded by motion.  Left vertebral artery patent to the basilar. Right vertebral artery is severely diseased. Mild stenosis in the basilar. Superior cerebellar and posterior cerebral arteries are patent. Moderate disease in the left posterior cerebral artery .  Internal carotid artery patent bilaterally. Anterior and middle cerebral arteries are poorly visualized due to motion. There appears to be a severe stenosis of the right M1 segment. Mild disease in the left M1 segment. Both anterior cerebral arteries are  patent and poorly evaluated on this study.  IMPRESSION: Small areas of acute infarct involving the left parietal white matter, left cingulate gyrus, and left internal capsule.  Advanced chronic ischemic change right greater than left. Prior hemorrhage on the right is noted with improvement.  MRA is severely limited due to motion. There is a probable severe stenosis in the right M1 segment and mild disease in the left M1 segment. Severe disease distal right vertebral artery. Moderate disease in the left posterior cerebral artery.   Electronically Signed   By: Franchot Gallo M.D.   On: 03/03/2014 19:09     PHYSICAL EXAM Frail elderly african [place] male not in distress.Awake alert. Afebrile. Head is nontraumatic. Neck is supple without bruit. Hearing is normal. Cardiac exam  no murmur or gallop. Lungs are clear to auscultation. Distal pulses are well felt. Neurological Exam : awake alert oriented x3. Speech is slightly hesitant and slow but without dysarthria or aphasia. Extraocular movements are full range but there is saccadic dysmetria on lateral gaze left more than right. No nystagmus. Fundi were not visualized. Vision acuity seems adequate. Face is symmetric without weakness. Tongue is midline. Jaw jerk is brisk. Motor system exam revealed no upper or lower extremity drift. Mild weakness of left grip and intrinsic hand muscles. Orbits right over left upper extremity. Deep tendon reflexes are 3+ brisk bilaterally. Plantars are both equivocal. Sensation appears preserved bilaterally. Gait was not tested. ASSESSMENT/PLAN Jermaine Lowery is a 58 y.o. male with history of prior stroke and hypertension presenting with progressive difficulty walking and doing ADLs.Marland Kitchen He did not receive IV t-PA due to delay in arrival.   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 Severe disease distal right vertebral artery. Moderate disease left posterior cerebral artery  Carotid Doppler  pending   2D Echo  pending   HgbA1c 6.0  In July  Lovenox 40 mg sq daily for VTE prophylaxis  Diet Heart thin liquids  aspirin 81 mg orally every day and clopidogrel 75 mg orally every day prior to admission, now on aspirin 81 mg orally every day and clopidogrel 75 mg orally every day  Patient counseled to be compliant with his antithrombotic medications  Ongoing aggressive risk factor management  Therapy recommendations:  CIR  Disposition:  CIR. Consult placed  Hypertension  Stable  Hyperlipidemia  Home meds:  Pravachol, resumed in hospital  LDL 58 goal < 70  Continue statin at discharge  Other Stroke Risk Factors Former Cigarette smoker, advised to stop smoking . Hx stroke/TIA  Hospital day # 1  [facility] BIBY, MSN, APRN, ANVP-BC, AGPCNP-BC [facility] Pager: [phone] 03/04/2014 3:33 PM  I have personally examined this patient, reviewed notes, independently viewed imaging studies, participated in medical decision making and plan of care. I have made any additions or clarifications directly to the above note. Agree with note above. Patient has multiple tiny infarct due to small vessel disease plus prior study of hemorrhage and cerebrovascular disease as well with clinical exam compatible with pseudobulbar state. Antony Contras, MD Medical Director [facility] Pager: [phone] 03/04/2014 3:37 PM   To contact Stroke Continuity provider, please refer to [email or web address] After hours, contact General Neurology

## 2014-03-04 NOTE — Progress Notes (Signed)
Patient Demographics  Jermaine Lowery, is a 58 y.o. male, DOB - [birth year], PYK:[id number]  Admit date - 03/03/2014   Admitting Physician Berle Mull, MD  Outpatient Primary MD for the patient is [name], MD  LOS - 1   Chief Complaint  Patient presents with  . Urinary Incontinence        Subjective:   [name] today has, No headache, No chest pain, No abdominal pain - No Nausea, No new weakness tingling or numbness, No Cough - SOB.    Assessment & Plan    1. 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 Continue monitoring on tele, obtain PT, OT, speech input, A1c and lipid panel, noted MRI MRA brain, pending echogram, carotid duplex, neurology following, changed aspirin to full dose and was patient is already on Plavix (at home he was on Plavix along with 81 of aspirin)   Lab Results  Component Value Date   HGBA1C 6.0* 11/27/2013    Lab Results  Component Value Date   CHOL 137 03/04/2014   HDL 50 03/04/2014   LDLCALC 58 03/04/2014   TRIG 146 03/04/2014   CHOLHDL 2.7 03/04/2014     2. Urinary retention. Place on Flomax and Foley catheter for now.   3. Dyslipidemia. Continue home dose statin.   4. Essential hypertension.on Norvasc continue.      Code Status: full  Family Communication: none present  Disposition Plan: to be decided   Procedures CT head, MRI/MRA brain, echo, carotid duplex   Consults  Neuro   Medications  Scheduled Meds: . amLODipine  10 mg Oral Daily  . aspirin EC  81 mg Oral Daily  . clopidogrel  75 mg Oral Daily  . enoxaparin (LOVENOX) injection  40 mg Subcutaneous Q24H  .  [START ON 03/05/2014] Influenza vac split quadrivalent PF  0.5 mL Intramuscular Tomorrow-1000  . pravastatin  40 mg Oral Daily  . sertraline  50 mg Oral Daily   Continuous Infusions:  PRN Meds:.acetaminophen, senna-docusate  DVT Prophylaxis  Lovenox   Lab Results  Component Value Date   PLT 238 03/04/2014    Antibiotics    Anti-infectives    None          Objective:   Filed Vitals:   03/04/14 0400 03/04/14 0601 03/04/14 0800 03/04/14 1000  BP: 135/90 152/103 150/97 152/88  Pulse: 70 65 88 90  Temp: 98.2 F (36.8 C) 99 F (37.2 C) 98.1 F (36.7 C) 97.9 F (36.6 C)  TempSrc: Oral Axillary Oral Oral  Resp: 16 18 18 18   Height:    5\' 9"  (1.753 m)  Weight:    71.487 kg (157 lb 9.6 oz)  SpO2: 99% 100% 99% 96%    Wt Readings from Last 3 Encounters:  03/04/14 71.487 kg (157 lb 9.6 oz)  11/28/13 70.398 kg (155 lb 3.2 oz)  02/03/13 75.161 kg (165 lb 11.2 oz)     Intake/Output Summary (Last 24 hours) at 03/04/14 1327 Last data filed at 03/03/14 1529  Gross per 24 hour  Intake      0 ml  Output  250 ml  Net   -250 ml     Physical Exam  Awake Alert, Oriented X 3, No new F.N deficits, Normal affect [place].AT,PERRAL Supple Neck,No JVD, No cervical lymphadenopathy appriciated.  Symmetrical Chest wall movement, Good air movement bilaterally, CTAB RRR,No Gallops,Rubs or new Murmurs, No Parasternal Heave +ve B.Sounds, Abd Soft, No tenderness, No organomegaly appriciated, No rebound - guarding or rigidity. No Cyanosis, Clubbing or edema, No new Rash or bruise     Data Review   Micro Results No results found for this or any previous visit (from the past 240 hour(s)).  Radiology Reports Dg Thoracic Spine W/swimmers  03/03/2014   CLINICAL DATA:  Status post fall 1 week ago now with onset of incontinence of urine and stool beginning yesterday ; history of previous CVA  EXAM: THORACIC SPINE - 2 VIEW + SWIMMERS  COMPARISON:  PA and lateral chest x-rays of November 28, 2013   FINDINGS: The thoracic vertebral bodies are preserved in height. The intervertebral disc space heights are reasonably well maintained. There is gentle S-shaped thoracolumbar curvature. There are no abnormal paravertebral soft tissue densities. There is a bridging osteophyte on the left at T9-T10.  IMPRESSION: There is no evidence of an acute compression fracture of the thoracic spine nor other acute thoracic spine abnormality.   Electronically Signed   By: David  Martinique   On: 03/03/2014 11:56   Dg Lumbar Spine Complete  03/03/2014   CLINICAL DATA:  Fall 1 week ago with trauma to head. The patient is now in kind at the bowel and bladder.  EXAM: LUMBAR SPINE - COMPLETE 4+ VIEW  COMPARISON:  None.  FINDINGS: Five non rib-bearing lumbar type vertebral bodies are present. Vertebral body heights are normal. There is slight anterolisthesis at L4-5 and L5-S1. Degenerative facet changes are present in the lower lumbar spine. Slight anterolisthesis is noted at L4-5. Alignment is otherwise anatomic. Atherosclerotic calcifications are present in the aorta and branch vessels without aneurysm.  IMPRESSION: 1. Degenerative changes in the lower lumbar spine, particularly at L4-5 and L5-S1. 2. No acute abnormality. 3. Atherosclerosis.   Electronically Signed   By: Lawrence Santiago M.D.   On: 03/03/2014 11:56   Ct Head Wo Contrast  03/03/2014   CLINICAL DATA:  Head injury related to fall 1 week ago. Initial encounter  EXAM: CT HEAD WITHOUT CONTRAST  TECHNIQUE: Contiguous axial images were obtained from the base of the skull through the vertex without intravenous contrast.  COMPARISON:  02/03/2013  FINDINGS: Skull and Sinuses:Negative for fracture or destructive process. The mastoids, middle ears, and imaged paranasal sinuses are clear.  Orbits: No acute abnormality.  Brain: No evidence of acute abnormality, such as acute infarction, hemorrhage, hydrocephalus, or mass lesion/mass effect.  Newly visible by CT, but remote, there is  a small infarct involving the cortex and subcortical white matter of the high and posterior right frontal lobe. This was noted on brain MRI 11/28/2013. There is extensive remote small vessel ischemic injury to the deep gray nuclei and deep white matter tracts. The pattern is stable from 2014, with infarcts involving the bilateral putamen, bilateral internal capsule, right caudate head, and periventricular white matter. Generalized brain atrophy.  IMPRESSION: 1. No acute intracranial findings. 2. Extensive remote ischemic injury, as described above.   Electronically Signed   By: Jorje Guild M.D.   On: 03/03/2014 11:32   [name] Head Wo Contrast  03/03/2014   CLINICAL DATA:  Bilateral leg weakness.  History of stroke  EXAM: MRI HEAD WITHOUT CONTRAST  MRA HEAD WITHOUT CONTRAST  TECHNIQUE: Multiplanar, multiecho pulse sequences of the brain and surrounding structures were obtained without intravenous contrast. Angiographic images of the head were obtained using MRA technique without contrast.  COMPARISON:  CT 03/03/2014.  MRI 11/28/2013  FINDINGS: MRI HEAD FINDINGS  Image quality degraded by motion. The patient had increasingly difficult time holding still as the study progressed.  Moderate atrophy. Extensive chronic ischemic change. Chronic infarcts are present in the basal ganglia bilaterally, right greater than left. Chronic ischemia throughout the cerebral white matter right greater than left.  Small area of acute infarct in the left parietal white matter measuring 5 x 10 mm. Possible small area of acute infarct in the cingulate gyrus near the corpus callosum in the left frontal lobe. Small area of acute infarct in the left internal capsule.  Improvement in restricted diffusion in a sulcus on the right. This likely is an area of prior hemorrhage.  Negative for mass or edema.  Negative for hydrocephalus.  MRA HEAD FINDINGS  Image quality is significantly degraded by motion.  Left vertebral artery patent to the  basilar. Right vertebral artery is severely diseased. Mild stenosis in the basilar. Superior cerebellar and posterior cerebral arteries are patent. Moderate disease in the left posterior cerebral artery .  Internal carotid artery patent bilaterally. Anterior and middle cerebral arteries are poorly visualized due to motion. There appears to be a severe stenosis of the right M1 segment. Mild disease in the left M1 segment. Both anterior cerebral arteries are patent and poorly evaluated on this study.  IMPRESSION: Small areas of acute infarct involving the left parietal white matter, left cingulate gyrus, and left internal capsule.  Advanced chronic ischemic change right greater than left. Prior hemorrhage on the right is noted with improvement.  MRA is severely limited due to motion. There is a probable severe stenosis in the right M1 segment and mild disease in the left M1 segment. Severe disease distal right vertebral artery. Moderate disease in the left posterior cerebral artery.   Electronically Signed   By: Franchot Gallo M.D.   On: 03/03/2014 19:09   Mr Brain Wo Contrast  03/03/2014   CLINICAL DATA:  Bilateral leg weakness.  History of stroke  EXAM: MRI HEAD WITHOUT CONTRAST  MRA HEAD WITHOUT CONTRAST  TECHNIQUE: Multiplanar, multiecho pulse sequences of the brain and surrounding structures were obtained without intravenous contrast. Angiographic images of the head were obtained using MRA technique without contrast.  COMPARISON:  CT 03/03/2014.  MRI 11/28/2013  FINDINGS: MRI HEAD FINDINGS  Image quality degraded by motion. The patient had increasingly difficult time holding still as the study progressed.  Moderate atrophy. Extensive chronic ischemic change. Chronic infarcts are present in the basal ganglia bilaterally, right greater than left. Chronic ischemia throughout the cerebral white matter right greater than left.  Small area of acute infarct in the left parietal white matter measuring 5 x 10 mm.  Possible small area of acute infarct in the cingulate gyrus near the corpus callosum in the left frontal lobe. Small area of acute infarct in the left internal capsule.  Improvement in restricted diffusion in a sulcus on the right. This likely is an area of prior hemorrhage.  Negative for mass or edema.  Negative for hydrocephalus.  MRA HEAD FINDINGS  Image quality is significantly degraded by motion.  Left vertebral artery patent to the basilar. Right vertebral artery is severely diseased. Mild stenosis in the basilar. Superior cerebellar and  posterior cerebral arteries are patent. Moderate disease in the left posterior cerebral artery .  Internal carotid artery patent bilaterally. Anterior and middle cerebral arteries are poorly visualized due to motion. There appears to be a severe stenosis of the right M1 segment. Mild disease in the left M1 segment. Both anterior cerebral arteries are patent and poorly evaluated on this study.  IMPRESSION: Small areas of acute infarct involving the left parietal white matter, left cingulate gyrus, and left internal capsule.  Advanced chronic ischemic change right greater than left. Prior hemorrhage on the right is noted with improvement.  MRA is severely limited due to motion. There is a probable severe stenosis in the right M1 segment and mild disease in the left M1 segment. Severe disease distal right vertebral artery. Moderate disease in the left posterior cerebral artery.   Electronically Signed   By: Franchot Gallo M.D.   On: 03/03/2014 19:09     CBC  Recent Labs Lab 03/03/14 1025 03/04/14 0618  WBC 4.4 4.3  HGB 14.1 14.0  HCT 42.9 42.1  PLT 232 238  MCV 84.1 83.9  MCH 27.6 27.9  MCHC 32.9 33.3  RDW 14.1 14.2  LYMPHSABS 1.5 1.5  MONOABS 0.5 0.5  EOSABS 0.1 0.1  BASOSABS 0.0 0.0    Chemistries   Recent Labs Lab 03/03/14 1025 03/04/14 0449  NA 140 137  K 4.0 4.3  CL 102 101  CO2 26 23  GLUCOSE 77 80  BUN 17 13  CREATININE 1.09 1.03    CALCIUM 9.2 9.4  AST  --  57*  ALT  --  57*  ALKPHOS  --  107  BILITOT  --  0.2*   ------------------------------------------------------------------------------------------------------------------ estimated creatinine clearance is 79.1 mL/min (by C-G formula based on Cr of 1.03). ------------------------------------------------------------------------------------------------------------------ No results for input(s): HGBA1C in the last 72 hours. ------------------------------------------------------------------------------------------------------------------  Recent Labs  03/04/14 0449  CHOL 137  HDL 50  LDLCALC 58  TRIG 146  CHOLHDL 2.7   ------------------------------------------------------------------------------------------------------------------ No results for input(s): TSH, T4TOTAL, T3FREE, THYROIDAB in the last 72 hours.  Invalid input(s): FREET3 ------------------------------------------------------------------------------------------------------------------ No results for input(s): VITAMINB12, FOLATE, FERRITIN, TIBC, IRON, RETICCTPCT in the last 72 hours.  Coagulation profile  Recent Labs Lab 03/04/14 0618  INR 0.99   Lab Results  Component Value Date   HGBA1C 6.0* 11/27/2013    No results for input(s): DDIMER in the last 72 hours.  Cardiac Enzymes No results for input(s): CKMB, TROPONINI, MYOGLOBIN in the last 168 hours.  Invalid input(s): CK ------------------------------------------------------------------------------------------------------------------ Invalid input(s): POCBNP     Time Spent in minutes  35   Kishia Shackett K M.D on 03/04/2014 at 1:27 PM  Between 7am to 7pm - Pager - [phone]  After 7pm go to [email or web address] - password [email or web address]  And look for the night coverage person covering for me after hours  [facility] Office  [phone]

## 2014-03-04 NOTE — Evaluation (Signed)
Physical Therapy Evaluation Patient Details Name: Yon Schiffman MRN: [id number] DOB: 06-Jul-1955 Today's Date: 03/04/2014   History of Present Illness  Adm 03/03/14 with recent falls and urinary and bowel retention and incontinence. MRI showed small infarcts Lt parietal, Lt frontal, and Lt internal capsule. PMHx-Rt posterior frontal, internal capsule CVA with residual Lt sided weakness; prostate Ca  Clinical Impression  Pt admitted with acute Lt CVA (frontal, parietal, and internal capsule). Pt able to follow commands, although with delay and demonstrates bradykinesia. Had at least 2 falls (posteriorly per pt) PTA and can benefit from intensive therapies to increase safety with mobility. Pt currently with functional limitations due to the deficits listed below (see PT Problem List).      Follow Up Recommendations CIR    Equipment Recommendations  3in1 (PT)    Recommendations for Other Services Rehab consult;OT consult;Speech consult     Precautions / Restrictions Precautions Precautions: Fall Precaution Comments: reports he has fallen twice in last few months (?accuracy)      Mobility  Bed Mobility Overal bed mobility: Needs Assistance Bed Mobility: Supine to Sit     Supine to sit: Mod assist     General bed mobility comments: HOB 0, no rail; pt reports he exits to his Rt at home; very slow with no use of momentum, pt self-selects supine to sit at EOB with falling posteriorly as coming up (unable to overcome gravity without assist); poor coordination to use bil UEs to scoot out to EOB in sitting  Transfers Overall transfer level: Needs assistance Equipment used: Rolling walker (2 wheeled) Transfers: Sit to/from Stand Sit to Stand: Min assist         General transfer comment: vc for safe use of RW; pt chooses to use Rt hand on RW with RW moving/sliding; pt with posterior bias which he can feel but cannot self-correct; x 3  Ambulation/Gait Ambulation/Gait assistance:  Min assist Ambulation Distance (Feet): 15 Feet (x2) Assistive device: Rolling walker (2 wheeled) Gait Pattern/deviations: Step-through pattern;Decreased stride length;Decreased weight shift to right;Decreased weight shift to left;Shuffle;Trunk flexed   Gait velocity interpretation: Below normal speed for age/gender General Gait Details: pt initially with wide BOS which narrowed as he progressed; able to turn RW himself , however let go of RW x2 as approaching toilet with vc to grasp until he is aligned with surface to sit  Stairs            Wheelchair Mobility    Modified Rankin (Stroke Patients Only) Modified Rankin (Stroke Patients Only) Pre-Morbid Rankin Score: Moderate disability Modified Rankin: Moderately severe disability     Balance Overall balance assessment: Needs assistance;History of Falls Sitting-balance support: Bilateral upper extremity supported;Feet unsupported Sitting balance-Leahy Scale: Poor Sitting balance - Comments: posterior and Rt lean   Standing balance support: No upper extremity supported Standing balance-Leahy Scale: Poor Standing balance comment: posterior lean without bil UE support         Rhomberg - Eyes Opened: 0 (cannot attain position unless he has bil UE support)       Standardized Balance Assessment Standardized Balance Assessment : Berg Balance Test Berg Balance Test Sit to Stand: Needs minimal aid to stand or to stabilize Standing Unsupported: Needs several tries to stand 30 seconds unsupported Sitting with Back Unsupported but Feet Supported on Floor or Stool: Able to sit 30 seconds Stand to Sit: Controls descent by using hands Transfers: Needs one person to assist Standing Unsupported with Eyes Closed: Unable to keep eyes closed 3 seconds  but stays steady Standing Ubsupported with Feet Together: Needs help to attain position and unable to hold for 15 seconds From Standing, Reach Forward with Outstretched Arm: Reaches forward  but needs supervision From Standing Position, Pick up Object from Floor: Unable to try/needs assist to keep balance From Standing Position, Turn to Look Behind Over each Shoulder: Needs assist to keep from losing balance and falling Turn 360 Degrees: Needs assistance while turning Standing Unsupported, Alternately Place Feet on Step/Stool: Needs assistance to keep from falling or unable to try Standing Unsupported, One Foot in Front: Loses balance while stepping or standing Standing on One Leg: Unable to try or needs assist to prevent fall Total Score: 10         Pertinent Vitals/Pain BP supine 150/97 BP after walk to/from bathroom 164/101  Pain Assessment: No/denies pain    Home Living Family/patient expects to be discharged to:: Private residence Living Arrangements: Other relatives (sister) Available Help at Discharge: Family;Available PRN/intermittently Type of Home: House Home Access: Stairs to enter Entrance Stairs-Rails: None Entrance Stairs-Number of Steps: 4 Home Layout: One level Home Equipment: Environmental consultant - 2 wheels Additional Comments: info from prior chart; pt confirmed    Prior Function Level of Independence: Needs assistance   Gait / Transfers Assistance Needed: walks with RW modified ind (although now with recent falls); reports he did stairs independently with no device (?accuracy)  ADL's / Homemaking Assistance Needed: reports sister assists with bathing and dressing  Comments: info from pt; ?accuracy     Hand Dominance   Dominant Hand: Right    Extremity/Trunk Assessment   Upper Extremity Assessment: Defer to OT evaluation           Lower Extremity Assessment: RLE deficits/detail;LLE deficits/detail RLE Deficits / Details: AROM WFL although bradykinesia; hip flexion 3+, knee extension 4+, DF 4+ LLE Deficits / Details: AROM WFL; hip flexion 4/5; knee extension 4+/5, DF 5/5  Cervical / Trunk Assessment: Kyphotic  Communication   Communication:  Expressive difficulties (very delayed responses; required repetiition of ?s )  Cognition Arousal/Alertness: Awake/alert Behavior During Therapy: Flat affect Overall Cognitive Status: No family/caregiver present to determine baseline cognitive functioning                      General Comments General comments (skin integrity, edema, etc.): Pt wearing incontinence brief on arrival. Required assist to pull brief down to sit on toilet (lt hand not able to find/grasp top of brief). As he pulled brief down, he began to urinate onto floor (incontinent) and due to bradykinesia and slow descent to toilet, he urinated a signficant amount onto floor.     Exercises        Assessment/Plan    PT Assessment Patient needs continued PT services  PT Diagnosis Hemiplegia dominant side   PT Problem List Decreased strength;Decreased balance;Decreased mobility;Decreased cognition;Decreased knowledge of use of DME;Decreased safety awareness;Decreased knowledge of precautions  PT Treatment Interventions DME instruction;Gait training;Stair training;Functional mobility training;Therapeutic activities;Balance training;Neuromuscular re-education;Cognitive remediation;Patient/family education   PT Goals (Current goals can be found in the Care Plan section) Acute Rehab PT Goals Patient Stated Goal: stop falling PT Goal Formulation: With patient Time For Goal Achievement: 03/11/14 Potential to Achieve Goals: Good    Frequency Min 4X/week   Barriers to discharge Decreased caregiver support      Co-evaluation               End of Session Equipment Utilized During Treatment: Gait belt Activity Tolerance: Patient  tolerated treatment well Patient left: in bed;with call bell/phone within reach;with bed alarm set Nurse Communication: Mobility status         Time: 0828-0900 PT Time Calculation (min): 32 min   Charges:         PT G Codes:          Nashawn Hillock 03-22-14, 9:31 AM Pager  [phone]

## 2014-03-05 DIAGNOSIS — R931 Abnormal findings on diagnostic imaging of heart and coronary circulation: Secondary | ICD-10-CM | POA: Insufficient documentation

## 2014-03-05 LAB — TROPONIN I
Troponin I: <0.3 ng/mL (ref <0.30)
Troponin I: <0.3 ng/mL (ref <0.30)

## 2014-03-05 NOTE — Progress Notes (Addendum)
Patient Demographics  Jermaine Lowery, is a 58 y.o. male, DOB - [birth year], VOJ:[id number]  Admit date - 03/03/2014   Admitting Physician Berle Mull, MD  Outpatient Primary MD for the patient is [name], MD  LOS - 2   Chief Complaint  Patient presents with  . Urinary Incontinence        Subjective:   Jermaine Lowery today has, No headache, No chest pain, No abdominal pain - No Nausea, No new weakness tingling or numbness, No Cough - SOB.    Assessment & Plan    1. 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 Continue monitoring on tele, obtain PT, OT, speech input, A1c and lipid panel, noted MRI MRA brain, nonacute Echogram, pending carotid duplex, neurology following, discussed his antiplatelet regimen with Dr. Leonie Man neurologist on 03/05/2014, he recommends 325 of aspirin only and wants to discontinue Plavix at this time.   Lab Results  Component Value Date   HGBA1C 5.9* 03/04/2014    Lab Results  Component Value Date   CHOL 137 03/04/2014   HDL 50 03/04/2014   LDLCALC 58 03/04/2014   TRIG 146 03/04/2014   CHOLHDL 2.7 03/04/2014     2. Urinary retention. Place on Flomax and Foley catheter for now.   3. Dyslipidemia. Continue home dose statin.   4. Essential hypertension.on Norvasc continue.    5. Wall motion abnormality noted on Echo - new as compared to echogram few months ago, he is chest pain-free, EKG nonacute, check one set of troponin, continue aspirin and statin. Request cardiology input.    Code Status: full  Family Communication: none present  Disposition Plan: CIR versus  rehabilitation   Procedures CT head, MRI/MRA brain, echo, carotid duplex   Consults  Neuro, cardiology   Medications  Scheduled Meds: . amLODipine  10 mg Oral Daily  . aspirin EC  325 mg Oral Daily  . enoxaparin (LOVENOX) injection  40 mg Subcutaneous Q24H  . Influenza vac split quadrivalent PF  0.5 mL Intramuscular Tomorrow-1000  . pravastatin  40 mg Oral Daily  . sertraline  50 mg Oral Daily  . tamsulosin  0.4 mg Oral QHS   Continuous Infusions:  PRN Meds:.acetaminophen, senna-docusate  DVT Prophylaxis  Lovenox   Lab Results  Component Value Date   PLT 238 03/04/2014    Antibiotics    Anti-infectives    None          Objective:   Filed Vitals:   03/04/14 1745 03/04/14 2115 03/05/14 0315 03/05/14 0628  BP: 160/96 130/84 121/93 124/79  Pulse: 95 94 88 98  Temp: 98.6 F (37 C) 98.4 F (36.9 C) 97.6 F (36.4 C) 97.9 F (36.6 C)  TempSrc: Axillary Oral Oral Oral  Resp: 18 18 18 18   Height:      Weight:      SpO2: 95% 99% 99% 98%    Wt Readings from Last 3 Encounters:  03/04/14 71.487 kg (157 lb 9.6 oz)  11/28/13 70.398 kg (155 lb 3.2 oz)  02/03/13 75.161 kg (165 lb 11.2 oz)     Intake/Output  Summary (Last 24 hours) at 03/05/14 0918 Last data filed at 03/05/14 0800  Gross per 24 hour  Intake    240 ml  Output    200 ml  Net     40 ml     Physical Exam  Awake Alert, Oriented X 3, No new F.N deficits, has chronic left-sided weakness, mild right-sided weakness now, flat affect [place].AT,PERRAL Supple Neck,No JVD, No cervical lymphadenopathy appriciated.  Symmetrical Chest wall movement, Good air movement bilaterally, CTAB RRR,No Gallops,Rubs or new Murmurs, No Parasternal Heave +ve B.Sounds, Abd Soft, No tenderness, No organomegaly appriciated, No rebound - guarding or rigidity. No Cyanosis, Clubbing or edema, No new Rash or bruise     Data Review   Micro Results No results found for this or any previous visit (from the past 240  hour(s)).  Radiology Reports   Carotids Pending    TTE  - Left ventricle: The cavity size was normal. Wall thickness was normal. Systolic function was normal. The estimated ejection fraction was in the range of 60% to 65%. There is hypokinesis of the basalinferior myocardium. Doppler parameters are consistent with abnormal left ventricular relaxation (grade 1 diastolic dysfunction).  Impressions:  - No cardiac source of emboli was indentified.      Dg Thoracic Spine W/swimmers  03/03/2014   CLINICAL DATA:  Status post fall 1 week ago now with onset of incontinence of urine and stool beginning yesterday ; history of previous CVA  EXAM: THORACIC SPINE - 2 VIEW + SWIMMERS  COMPARISON:  PA and lateral chest x-rays of November 28, 2013  FINDINGS: The thoracic vertebral bodies are preserved in height. The intervertebral disc space heights are reasonably well maintained. There is gentle S-shaped thoracolumbar curvature. There are no abnormal paravertebral soft tissue densities. There is a bridging osteophyte on the left at T9-T10.  IMPRESSION: There is no evidence of an acute compression fracture of the thoracic spine nor other acute thoracic spine abnormality.   Electronically Signed   By: David  Martinique   On: 03/03/2014 11:56   Dg Lumbar Spine Complete  03/03/2014   CLINICAL DATA:  Fall 1 week ago with trauma to head. The patient is now in kind at the bowel and bladder.  EXAM: LUMBAR SPINE - COMPLETE 4+ VIEW  COMPARISON:  None.  FINDINGS: Five non rib-bearing lumbar type vertebral bodies are present. Vertebral body heights are normal. There is slight anterolisthesis at L4-5 and L5-S1. Degenerative facet changes are present in the lower lumbar spine. Slight anterolisthesis is noted at L4-5. Alignment is otherwise anatomic. Atherosclerotic calcifications are present in the aorta and branch vessels without aneurysm.  IMPRESSION: 1. Degenerative changes in the lower lumbar spine, particularly at  L4-5 and L5-S1. 2. No acute abnormality. 3. Atherosclerosis.   Electronically Signed   By: Lawrence Santiago M.D.   On: 03/03/2014 11:56   Ct Head Wo Contrast  03/03/2014   CLINICAL DATA:  Head injury related to fall 1 week ago. Initial encounter  EXAM: CT HEAD WITHOUT CONTRAST  TECHNIQUE: Contiguous axial images were obtained from the base of the skull through the vertex without intravenous contrast.  COMPARISON:  02/03/2013  FINDINGS: Skull and Sinuses:Negative for fracture or destructive process. The mastoids, middle ears, and imaged paranasal sinuses are clear.  Orbits: No acute abnormality.  Brain: No evidence of acute abnormality, such as acute infarction, hemorrhage, hydrocephalus, or mass lesion/mass effect.  Newly visible by CT, but remote, there is a small infarct involving the cortex and subcortical  white matter of the high and posterior right frontal lobe. This was noted on brain MRI 11/28/2013. There is extensive remote small vessel ischemic injury to the deep gray nuclei and deep white matter tracts. The pattern is stable from 2014, with infarcts involving the bilateral putamen, bilateral internal capsule, right caudate head, and periventricular white matter. Generalized brain atrophy.  IMPRESSION: 1. No acute intracranial findings. 2. Extensive remote ischemic injury, as described above.   Electronically Signed   By: Jorje Guild M.D.   On: 03/03/2014 11:32   [name] Head Wo Contrast  03/03/2014   CLINICAL DATA:  Bilateral leg weakness.  History of stroke  EXAM: MRI HEAD WITHOUT CONTRAST  MRA HEAD WITHOUT CONTRAST  TECHNIQUE: Multiplanar, multiecho pulse sequences of the brain and surrounding structures were obtained without intravenous contrast. Angiographic images of the head were obtained using MRA technique without contrast.  COMPARISON:  CT 03/03/2014.  MRI 11/28/2013  FINDINGS: MRI HEAD FINDINGS  Image quality degraded by motion. The patient had increasingly difficult time holding still as  the study progressed.  Moderate atrophy. Extensive chronic ischemic change. Chronic infarcts are present in the basal ganglia bilaterally, right greater than left. Chronic ischemia throughout the cerebral white matter right greater than left.  Small area of acute infarct in the left parietal white matter measuring 5 x 10 mm. Possible small area of acute infarct in the cingulate gyrus near the corpus callosum in the left frontal lobe. Small area of acute infarct in the left internal capsule.  Improvement in restricted diffusion in a sulcus on the right. This likely is an area of prior hemorrhage.  Negative for mass or edema.  Negative for hydrocephalus.  MRA HEAD FINDINGS  Image quality is significantly degraded by motion.  Left vertebral artery patent to the basilar. Right vertebral artery is severely diseased. Mild stenosis in the basilar. Superior cerebellar and posterior cerebral arteries are patent. Moderate disease in the left posterior cerebral artery .  Internal carotid artery patent bilaterally. Anterior and middle cerebral arteries are poorly visualized due to motion. There appears to be a severe stenosis of the right M1 segment. Mild disease in the left M1 segment. Both anterior cerebral arteries are patent and poorly evaluated on this study.  IMPRESSION: Small areas of acute infarct involving the left parietal white matter, left cingulate gyrus, and left internal capsule.  Advanced chronic ischemic change right greater than left. Prior hemorrhage on the right is noted with improvement.  MRA is severely limited due to motion. There is a probable severe stenosis in the right M1 segment and mild disease in the left M1 segment. Severe disease distal right vertebral artery. Moderate disease in the left posterior cerebral artery.   Electronically Signed   By: Franchot Gallo M.D.   On: 03/03/2014 19:09   Mr Brain Wo Contrast  03/03/2014   CLINICAL DATA:  Bilateral leg weakness.  History of stroke  EXAM: MRI  HEAD WITHOUT CONTRAST  MRA HEAD WITHOUT CONTRAST  TECHNIQUE: Multiplanar, multiecho pulse sequences of the brain and surrounding structures were obtained without intravenous contrast. Angiographic images of the head were obtained using MRA technique without contrast.  COMPARISON:  CT 03/03/2014.  MRI 11/28/2013  FINDINGS: MRI HEAD FINDINGS  Image quality degraded by motion. The patient had increasingly difficult time holding still as the study progressed.  Moderate atrophy. Extensive chronic ischemic change. Chronic infarcts are present in the basal ganglia bilaterally, right greater than left. Chronic ischemia throughout the cerebral white matter  right greater than left.  Small area of acute infarct in the left parietal white matter measuring 5 x 10 mm. Possible small area of acute infarct in the cingulate gyrus near the corpus callosum in the left frontal lobe. Small area of acute infarct in the left internal capsule.  Improvement in restricted diffusion in a sulcus on the right. This likely is an area of prior hemorrhage.  Negative for mass or edema.  Negative for hydrocephalus.  MRA HEAD FINDINGS  Image quality is significantly degraded by motion.  Left vertebral artery patent to the basilar. Right vertebral artery is severely diseased. Mild stenosis in the basilar. Superior cerebellar and posterior cerebral arteries are patent. Moderate disease in the left posterior cerebral artery .  Internal carotid artery patent bilaterally. Anterior and middle cerebral arteries are poorly visualized due to motion. There appears to be a severe stenosis of the right M1 segment. Mild disease in the left M1 segment. Both anterior cerebral arteries are patent and poorly evaluated on this study.  IMPRESSION: Small areas of acute infarct involving the left parietal white matter, left cingulate gyrus, and left internal capsule.  Advanced chronic ischemic change right greater than left. Prior hemorrhage on the right is noted with  improvement.  MRA is severely limited due to motion. There is a probable severe stenosis in the right M1 segment and mild disease in the left M1 segment. Severe disease distal right vertebral artery. Moderate disease in the left posterior cerebral artery.   Electronically Signed   By: Franchot Gallo M.D.   On: 03/03/2014 19:09     CBC  Recent Labs Lab 03/03/14 1025 03/04/14 0618  WBC 4.4 4.3  HGB 14.1 14.0  HCT 42.9 42.1  PLT 232 238  MCV 84.1 83.9  MCH 27.6 27.9  MCHC 32.9 33.3  RDW 14.1 14.2  LYMPHSABS 1.5 1.5  MONOABS 0.5 0.5  EOSABS 0.1 0.1  BASOSABS 0.0 0.0    Chemistries   Recent Labs Lab 03/03/14 1025 03/04/14 0449  NA 140 137  K 4.0 4.3  CL 102 101  CO2 26 23  GLUCOSE 77 80  BUN 17 13  CREATININE 1.09 1.03  CALCIUM 9.2 9.4  AST  --  57*  ALT  --  57*  ALKPHOS  --  107  BILITOT  --  0.2*   ------------------------------------------------------------------------------------------------------------------ estimated creatinine clearance is 79.1 mL/min (by C-G formula based on Cr of 1.03). ------------------------------------------------------------------------------------------------------------------  Recent Labs  03/04/14 0449  HGBA1C 5.9*   ------------------------------------------------------------------------------------------------------------------  Recent Labs  03/04/14 0449  CHOL 137  HDL 50  LDLCALC 58  TRIG 146  CHOLHDL 2.7   ------------------------------------------------------------------------------------------------------------------ No results for input(s): TSH, T4TOTAL, T3FREE, THYROIDAB in the last 72 hours.  Invalid input(s): FREET3 ------------------------------------------------------------------------------------------------------------------ No results for input(s): VITAMINB12, FOLATE, FERRITIN, TIBC, IRON, RETICCTPCT in the last 72 hours.  Coagulation profile  Recent Labs Lab 03/04/14 0618  INR 0.99   Lab Results   Component Value Date   HGBA1C 5.9* 03/04/2014    No results for input(s): DDIMER in the last 72 hours.  Cardiac Enzymes No results for input(s): CKMB, TROPONINI, MYOGLOBIN in the last 168 hours.  Invalid input(s): CK ------------------------------------------------------------------------------------------------------------------ Invalid input(s): POCBNP     Time Spent in minutes  35   Emiliana Blaize K M.D on 03/05/2014 at 9:18 AM  Between 7am to 7pm - Pager - [phone]  After 7pm go to [email or web address] - password [email or web address]  And look for the night coverage person covering for me after  hours  [facility] Office  [phone]

## 2014-03-05 NOTE — Consult Note (Signed)
Physical Medicine and Rehabilitation Consult Reason for Consult:CVA Referring Physician: [facility]   HPI: Jermaine Lowery is a 58 y.o.right handed male with history of CVA residual left sided weakness maintained on aspirin as well as Plavix , hypertension..Patient lives with his sister and used a walker prior to admission. Admitted 03/03/2014 with progressive gait difficulties over the past 3 weeks as well as bouts of incontinence. MRI of the brain shows small areas of acute infarct involving the left parietal white matter, left cingulate gyrus and left internal capsule. MRA of the head with probable severe stenosis of right M1 segment. Patient did not receive TPA. Echocardiogram with ejection fraction of 16% grade 1 diastolic dysfunction. Carotid Dopplers are pending. Neurology services consulted aspirin increased to 325 mg daily and continues on Plavix. Subcutaneous Lovenox added for DVT prophylaxis. Patient is tolerating a regular diet. Speech therapy evaluation noted slow processing time and requiring moderate cues with frequent repetitions of information.Physical therapy evaluation 03/04/2014 with recommendations of physical medicine rehabilitation consult.   Review of Systems  Gastrointestinal: Positive for constipation.  Genitourinary:       Bouts of urinary incontinence  Neurological: Positive for dizziness.  Psychiatric/Behavioral: Positive for depression.  All other systems reviewed and are negative.  Past Medical History  Diagnosis Date  . Stroke   . Hypertension   . Prostate cancer    Past Surgical History  Procedure Laterality Date  . Rotator cuff repair    . Prostate cacner     Family History  Problem Relation Age of Onset  . CAD Mother   . Diabetes Mellitus II Mother   . Diabetes Mellitus II Brother   . Stroke Paternal Uncle    Social History:  reports that he has quit smoking. He does not have any smokeless tobacco history on file. He reports that he does not  drink alcohol or use illicit drugs. Allergies:  Allergies  Allergen Reactions  . Naproxen Rash   Medications Prior to Admission  Medication Sig Dispense Refill  . amLODipine (NORVASC) 10 MG tablet Take 10 mg by mouth daily.    Marland Kitchen aspirin EC 81 MG tablet Take 81 mg by mouth daily.    . clopidogrel (PLAVIX) 75 MG tablet Take 1 tablet (75 mg total) by mouth daily. 30 tablet 0  . pravastatin (PRAVACHOL) 80 MG tablet Take 40 mg by mouth daily.    . sertraline (ZOLOFT) 100 MG tablet Take 50 mg by mouth daily.      Home: Home Living Family/patient expects to be discharged to:: Private residence Living Arrangements: Other relatives Available Help at Discharge: Family, Available PRN/intermittently (lives with sister who works outside the home) Type of Home: [place] Access: Stairs to enter Technical brewer of Steps: 4 Entrance Stairs-Rails: None Home Layout: One [place]: Environmental consultant - 2 wheels, Hand held shower head Additional Comments: info from prior chart; pt confirmed  Lives With: Family (sister)  Functional History: Prior Function Level of Independence: Needs assistance Gait / Transfers Assistance Needed: Walks with walker. Pt reported to MD that he does not use RW (?accuracy). ADL's / Homemaking Assistance Needed: reports sister assists with bathing and dressing Comments: info from pt; ?accuracy Functional Status:  Mobility: Bed Mobility Overal bed mobility: Needs Assistance Bed Mobility: Supine to Sit Supine to sit: Mod assist General bed mobility comments: HOB 0, no bedrail use. Pt reports exiting bed on R side at home. Pt falling posteriorly while attempting to sit EOB. Pt did not  respond to command to grab onto bedrail and did not grab onto therapist's hand when instructed to do so. Mod (A) for trunk support to maintain upright position. Transfers Overall transfer level: Needs assistance Equipment used: Rolling walker (2 wheeled) Transfers: Sit to/from  Stand Sit to Stand: Min assist General transfer comment: Min (A) to stabilize balance upon standing. Verbal cues for safe RW use and management. Ambulation/Gait Ambulation/Gait assistance: Min assist Ambulation Distance (Feet): 15 Feet (x2) Assistive device: Rolling walker (2 wheeled) Gait Pattern/deviations: Step-through pattern, Decreased stride length, Decreased weight shift to right, Decreased weight shift to left, Shuffle, Trunk flexed Gait velocity interpretation: Below normal speed for age/gender General Gait Details: pt initially with wide BOS which narrowed as he progressed; able to turn RW himself , however let go of RW x2 as approaching toilet with vc to grasp until he is aligned with surface to sit    ADL: ADL Overall ADL's : Needs assistance/impaired Grooming: Wash/dry hands, Min guard, Cueing for sequencing, Standing Grooming Details (indicate cue type and reason): Min guard (A) for safety/balance. Pt demonstrating posterior lean while standing at sink. Pt aware of leaning but unable to correct without tactile cues. Verbal cues to shut off water.  Lower Body Bathing: Min guard, Sitting/lateral leans Lower Body Dressing: Moderate assistance, Sitting/lateral leans, Sit to/from stand Lower Body Dressing Details (indicate cue type and reason): Mod (A) to thread briefs, don socks halfway, and to tie shoes. Pt stood to pull up briefs and needed verbal cues to finish pulling up right side. Pt able to finish donning socks over heel and put on shoes. Toilet Transfer: Minimal assistance, Ambulation, Regular Toilet, Grab bars, RW Toilet Transfer Details (indicate cue type and reason): Min (A) for controlled descend to toilet as pt was beginning to urinate on floor (incontinent).   Toileting- Clothing Manipulation and Hygiene: Minimal assistance, Cueing for safety, Sit to/from stand Toileting - Clothing Manipulation Details (indicate cue type and reason): Min (A) to move gown as pt was  beginning to urinate on floor and was unable to fully remove it. Functional mobility during ADLs: Minimal assistance, Rolling walker General ADL Comments: Pt demonstrating posterior lean during all adls assessed. Pt had LOB x2 while ambulating to and from bathroom. Pt also ambulating rocked back on heels and toes elevated. Pt required verbal cues for safe RW use and management and for safe hand placement. On return to chair, pt pushed RW aside and attempted to walk 3-4 steps without it. Pt cued to bring RW with him, but pt unable to due to slow processing of commands and delayed responses.  Cognition: Cognition Overall Cognitive Status: No family/caregiver present to determine baseline cognitive functioning Arousal/Alertness: Awake/alert Orientation Level: Oriented X4 Attention: Sustained Sustained Attention: Appears intact Memory: Impaired Memory Impairment: Retrieval deficit, Decreased recall of new information Awareness: Impaired Awareness Impairment: Intellectual impairment, Emergent impairment, Anticipatory impairment Problem Solving: Impaired Problem Solving Impairment: Verbal basic Executive Function: Reasoning Reasoning: Impaired Reasoning Impairment: Verbal basic Safety/Judgment: Impaired Cognition Arousal/Alertness: Awake/alert Behavior During Therapy: Flat affect Overall Cognitive Status: No family/caregiver present to determine baseline cognitive functioning  Blood pressure 121/93, pulse 88, temperature 97.6 F (36.4 C), temperature source Oral, resp. rate 18, height 5\' 9"  (1.753 m), weight 71.487 kg (157 lb 9.6 oz), SpO2 99 %. Physical Exam  Vitals reviewed. Constitutional: He appears well-developed.  HENT:  Head: Normocephalic.  Eyes: EOM are normal.  Neck: Normal range of motion. Neck supple. No thyromegaly present.  Cardiovascular: Normal rate and regular rhythm.  Respiratory: Effort normal and breath sounds normal. No respiratory distress.  GI: Soft. Bowel  sounds are normal. He exhibits no distension.  Neurological:  Alert/mood is flat but appropriate. He makes good eye contact with examiner. Provides name, age and date of birth. Follows simple commands. Mild RUE weakness, 4/5. RLE: 3+ hf, 4/5 knee/ankle. LUE and LLE: grossly 4+ to 5/5. Decreased [place] on right. Mild right facial weakness. Speech slightly dysarthric. Fair insight and awareness.   Skin: Skin is warm and dry.  Psychiatric: He has a normal mood and affect. His behavior is normal. Thought content normal.    Results for orders placed or performed during the hospital encounter of 03/03/14 (from the past 24 hour(s))  CBC with Differential     Status: None   Collection Time: 03/04/14  6:18 AM  Result Value Ref Range   WBC 4.3 4.0 - 10.5 K/uL   RBC 5.02 4.22 - 5.81 MIL/uL   Hemoglobin 14.0 13.0 - 17.0 g/dL   HCT 42.1 39.0 - 52.0 %   MCV 83.9 78.0 - 100.0 fL   MCH 27.9 26.0 - 34.0 pg   MCHC 33.3 30.0 - 36.0 g/dL   RDW 14.2 11.5 - 15.5 %   Platelets 238 150 - 400 K/uL   Neutrophils Relative % 48 43 - 77 %   Neutro Abs 2.1 1.7 - 7.7 K/uL   Lymphocytes Relative 36 12 - 46 %   Lymphs Abs 1.5 0.7 - 4.0 K/uL   Monocytes Relative 12 3 - 12 %   Monocytes Absolute 0.5 0.1 - 1.0 K/uL   Eosinophils Relative 3 0 - 5 %   Eosinophils Absolute 0.1 0.0 - 0.7 K/uL   Basophils Relative 1 0 - 1 %   Basophils Absolute 0.0 0.0 - 0.1 K/uL  Protime-INR     Status: None   Collection Time: 03/04/14  6:18 AM  Result Value Ref Range   Prothrombin Time 13.2 11.6 - 15.2 seconds   INR 0.99 0.00 - 1.49  Sedimentation rate     Status: None   Collection Time: 03/04/14  6:18 AM  Result Value Ref Range   Sed Rate 14 0 - 16 mm/hr  C-reactive protein     Status: Abnormal   Collection Time: 03/04/14  6:18 AM  Result Value Ref Range   CRP <0.5 (L) <0.60 mg/dL   Dg Thoracic Spine W/swimmers  03/03/2014   CLINICAL DATA:  Status post fall 1 week ago now with onset of incontinence of urine and stool beginning  yesterday ; history of previous CVA  EXAM: THORACIC SPINE - 2 VIEW + SWIMMERS  COMPARISON:  PA and lateral chest x-rays of November 28, 2013  FINDINGS: The thoracic vertebral bodies are preserved in height. The intervertebral disc space heights are reasonably well maintained. There is gentle S-shaped thoracolumbar curvature. There are no abnormal paravertebral soft tissue densities. There is a bridging osteophyte on the left at T9-T10.  IMPRESSION: There is no evidence of an acute compression fracture of the thoracic spine nor other acute thoracic spine abnormality.   Electronically Signed   By: David  Martinique   On: 03/03/2014 11:56   Dg Lumbar Spine Complete  03/03/2014   CLINICAL DATA:  Fall 1 week ago with trauma to head. The patient is now in kind at the bowel and bladder.  EXAM: LUMBAR SPINE - COMPLETE 4+ VIEW  COMPARISON:  None.  FINDINGS: Five non rib-bearing lumbar type vertebral bodies are present. Vertebral body heights are  normal. There is slight anterolisthesis at L4-5 and L5-S1. Degenerative facet changes are present in the lower lumbar spine. Slight anterolisthesis is noted at L4-5. Alignment is otherwise anatomic. Atherosclerotic calcifications are present in the aorta and branch vessels without aneurysm.  IMPRESSION: 1. Degenerative changes in the lower lumbar spine, particularly at L4-5 and L5-S1. 2. No acute abnormality. 3. Atherosclerosis.   Electronically Signed   By: Lawrence Santiago M.D.   On: 03/03/2014 11:56   Ct Head Wo Contrast  03/03/2014   CLINICAL DATA:  Head injury related to fall 1 week ago. Initial encounter  EXAM: CT HEAD WITHOUT CONTRAST  TECHNIQUE: Contiguous axial images were obtained from the base of the skull through the vertex without intravenous contrast.  COMPARISON:  02/03/2013  FINDINGS: Skull and Sinuses:Negative for fracture or destructive process. The mastoids, middle ears, and imaged paranasal sinuses are clear.  Orbits: No acute abnormality.  Brain: No evidence of acute  abnormality, such as acute infarction, hemorrhage, hydrocephalus, or mass lesion/mass effect.  Newly visible by CT, but remote, there is a small infarct involving the cortex and subcortical white matter of the high and posterior right frontal lobe. This was noted on brain MRI 11/28/2013. There is extensive remote small vessel ischemic injury to the deep gray nuclei and deep white matter tracts. The pattern is stable from 2014, with infarcts involving the bilateral putamen, bilateral internal capsule, right caudate head, and periventricular white matter. Generalized brain atrophy.  IMPRESSION: 1. No acute intracranial findings. 2. Extensive remote ischemic injury, as described above.   Electronically Signed   By: Jorje Guild M.D.   On: 03/03/2014 11:32   [name] Head Wo Contrast  03/03/2014   CLINICAL DATA:  Bilateral leg weakness.  History of stroke  EXAM: MRI HEAD WITHOUT CONTRAST  MRA HEAD WITHOUT CONTRAST  TECHNIQUE: Multiplanar, multiecho pulse sequences of the brain and surrounding structures were obtained without intravenous contrast. Angiographic images of the head were obtained using MRA technique without contrast.  COMPARISON:  CT 03/03/2014.  MRI 11/28/2013  FINDINGS: MRI HEAD FINDINGS  Image quality degraded by motion. The patient had increasingly difficult time holding still as the study progressed.  Moderate atrophy. Extensive chronic ischemic change. Chronic infarcts are present in the basal ganglia bilaterally, right greater than left. Chronic ischemia throughout the cerebral white matter right greater than left.  Small area of acute infarct in the left parietal white matter measuring 5 x 10 mm. Possible small area of acute infarct in the cingulate gyrus near the corpus callosum in the left frontal lobe. Small area of acute infarct in the left internal capsule.  Improvement in restricted diffusion in a sulcus on the right. This likely is an area of prior hemorrhage.  Negative for mass or edema.   Negative for hydrocephalus.  MRA HEAD FINDINGS  Image quality is significantly degraded by motion.  Left vertebral artery patent to the basilar. Right vertebral artery is severely diseased. Mild stenosis in the basilar. Superior cerebellar and posterior cerebral arteries are patent. Moderate disease in the left posterior cerebral artery .  Internal carotid artery patent bilaterally. Anterior and middle cerebral arteries are poorly visualized due to motion. There appears to be a severe stenosis of the right M1 segment. Mild disease in the left M1 segment. Both anterior cerebral arteries are patent and poorly evaluated on this study.  IMPRESSION: Small areas of acute infarct involving the left parietal white matter, left cingulate gyrus, and left internal capsule.  Advanced chronic ischemic  change right greater than left. Prior hemorrhage on the right is noted with improvement.  MRA is severely limited due to motion. There is a probable severe stenosis in the right M1 segment and mild disease in the left M1 segment. Severe disease distal right vertebral artery. Moderate disease in the left posterior cerebral artery.   Electronically Signed   By: Franchot Gallo M.D.   On: 03/03/2014 19:09   Mr Brain Wo Contrast  03/03/2014   CLINICAL DATA:  Bilateral leg weakness.  History of stroke  EXAM: MRI HEAD WITHOUT CONTRAST  MRA HEAD WITHOUT CONTRAST  TECHNIQUE: Multiplanar, multiecho pulse sequences of the brain and surrounding structures were obtained without intravenous contrast. Angiographic images of the head were obtained using MRA technique without contrast.  COMPARISON:  CT 03/03/2014.  MRI 11/28/2013  FINDINGS: MRI HEAD FINDINGS  Image quality degraded by motion. The patient had increasingly difficult time holding still as the study progressed.  Moderate atrophy. Extensive chronic ischemic change. Chronic infarcts are present in the basal ganglia bilaterally, right greater than left. Chronic ischemia throughout the  cerebral white matter right greater than left.  Small area of acute infarct in the left parietal white matter measuring 5 x 10 mm. Possible small area of acute infarct in the cingulate gyrus near the corpus callosum in the left frontal lobe. Small area of acute infarct in the left internal capsule.  Improvement in restricted diffusion in a sulcus on the right. This likely is an area of prior hemorrhage.  Negative for mass or edema.  Negative for hydrocephalus.  MRA HEAD FINDINGS  Image quality is significantly degraded by motion.  Left vertebral artery patent to the basilar. Right vertebral artery is severely diseased. Mild stenosis in the basilar. Superior cerebellar and posterior cerebral arteries are patent. Moderate disease in the left posterior cerebral artery .  Internal carotid artery patent bilaterally. Anterior and middle cerebral arteries are poorly visualized due to motion. There appears to be a severe stenosis of the right M1 segment. Mild disease in the left M1 segment. Both anterior cerebral arteries are patent and poorly evaluated on this study.  IMPRESSION: Small areas of acute infarct involving the left parietal white matter, left cingulate gyrus, and left internal capsule.  Advanced chronic ischemic change right greater than left. Prior hemorrhage on the right is noted with improvement.  MRA is severely limited due to motion. There is a probable severe stenosis in the right M1 segment and mild disease in the left M1 segment. Severe disease distal right vertebral artery. Moderate disease in the left posterior cerebral artery.   Electronically Signed   By: Franchot Gallo M.D.   On: 03/03/2014 19:09    Assessment/Plan: Diagnosis: left parietal infarct 1. Does the need for close, 24 hr/day medical supervision in concert with the patient's rehab needs make it unreasonable for this patient to be served in a less intensive setting? Yes 2. Co-Morbidities requiring supervision/potential complications:  htn,  3. Due to bladder management, bowel management, safety, skin/wound care, disease management, medication administration and patient education, does the patient require 24 hr/day rehab nursing? Yes 4. Does the patient require coordinated care of a physician, rehab nurse, PT (1-2 hrs/day, 5 days/week), OT (1-2 hrs/day, 5 days/week) and SLP (1-2 hrs/day, 5 days/week) to address physical and functional deficits in the context of the above medical diagnosis(es)? Yes Addressing deficits in the following areas: balance, endurance, locomotion, strength, transferring, bowel/bladder control, bathing, dressing, feeding, grooming, toileting, cognition, speech and psychosocial support  5. Can the patient actively participate in an intensive therapy program of at least 3 hrs of therapy per day at least 5 days per week? Yes 6. The potential for patient to make measurable gains while on inpatient rehab is excellent 7. Anticipated functional outcomes upon discharge from inpatient rehab are modified independent and supervision  with PT, modified independent and supervision with OT, modified independent and supervision with SLP. 8. Estimated rehab length of stay to reach the above functional goals is: 8-12 days 9. Does the patient have adequate social supports to accommodate these discharge functional goals? Yes 10. Anticipated D/C setting: Home 11. Anticipated post D/C treatments: [facility] therapy 12. Overall Rehab/Functional Prognosis: excellent  RECOMMENDATIONS: This patient's condition is appropriate for continued rehabilitative care in the following setting: CIR Patient has agreed to participate in recommended program. Yes Note that insurance prior authorization may be required for reimbursement for recommended care.  Comment: Rehab Admissions Coordinator to follow up.  Thanks,  Meredith Staggers, MD, Mellody Drown     03/05/2014

## 2014-03-05 NOTE — Progress Notes (Signed)
Physical Therapy Treatment Patient Details Name: Jermaine Lowery MRN: [id number] DOB: Mar 29, 1956 Today's Date: 03/05/2014    History of Present Illness 58 y.o. male admitted on 03/03/14 after recent falls, urinary and bowel retention, and incontinence. MRI showed small infarcts L parietal, L frontal, and L internal capsule. PMH of R posterior frontal, internal capsule CVA with residual L sided weakness; prostate Ca    PT Comments    Patient able to increase ambulation this session with Min A for safe use of RW due to left side lean especially with turns. Patient appears more stable with less posterior lean this session. May would benefit to retest BERG next session. Informed that [facility] will not approve CIR and patient now is reviewing SNFs for DC planning. Will update accordingly.   Follow Up Recommendations  SNF     Equipment Recommendations  3in1 (PT)    Recommendations for Other Services       Precautions / Restrictions Precautions Precautions: Fall Precaution Comments: reports he has fallen twice in last few months (?accuracy) Restrictions Weight Bearing Restrictions: No    Mobility  Bed Mobility Overal bed mobility: Needs Assistance Bed Mobility: Supine to Sit     Supine to sit: Min guard     General bed mobility comments: [place] for safety and for cues for positioning out of bed safely. Patient able to sit EOB without falling this session  Transfers Overall transfer level: Needs assistance Equipment used: Rolling walker (2 wheeled) Transfers: Sit to/from Stand Sit to Stand: Min assist         General transfer comment: Min (A) to stabilize balance upon standing. Verbal cues for safe RW use and management.  Ambulation/Gait Ambulation/Gait assistance: Min assist Ambulation Distance (Feet): 200 Feet Assistive device: Rolling walker (2 wheeled) Gait Pattern/deviations: Step-through pattern;Decreased stride length;Narrow base of support;Scissoring   Gait  velocity interpretation: Below normal speed for age/gender General Gait Details: Slight scissoring noted but with no crossover. Patient able to manuever RW however with L turn, R side of RW coming off of ground and A needed to maintain balance and control .    Stairs            Wheelchair Mobility    Modified Rankin (Stroke Patients Only) Modified Rankin (Stroke Patients Only) Pre-Morbid Rankin Score: Moderate disability Modified Rankin: Moderately severe disability     Balance     Sitting balance-Leahy Scale: Fair Sitting balance - Comments: No lean noted this session. ABle to sit without UE support     Standing balance-Leahy Scale: Poor Standing balance comment: decreased posterior lean but was noted slightly with initial stand                    Cognition Arousal/Alertness: Awake/alert Behavior During Therapy: Flat affect Overall Cognitive Status: No family/caregiver present to determine baseline cognitive functioning                      Exercises      General Comments        Pertinent Vitals/Pain Pain Assessment: No/denies pain    Home Living                      Prior Function            PT Goals (current goals can now be found in the care plan section) Progress towards PT goals: Progressing toward goals    Frequency  Min 4X/week    PT Plan  Current plan remains appropriate    Co-evaluation             End of Session Equipment Utilized During Treatment: Gait belt Activity Tolerance: Patient tolerated treatment well Patient left: in chair;with call bell/phone within reach     Time: 1434-1450 PT Time Calculation (min): 16 min  Charges:  $Gait Training: 8-22 mins                    G Codes:      Jacqualyn Posey 03/05/2014, 2:58 PM 03/05/2014 Jacqualyn Posey PTA [phone] pager [phone] office

## 2014-03-05 NOTE — Progress Notes (Signed)
*  PRELIMINARY RESULTS* Vascular Ultrasound Carotid Duplex (Doppler) has been completed.  Preliminary findings: Bilateral:  1-39% ICA stenosis.  Vertebral artery flow is antegrade.      [place], [place] 03/04/14

## 2014-03-05 NOTE — Progress Notes (Signed)
Spoke with family about his plan at discharge. His sister, Orson Aloe at [phone] would like SW or CM to contact her. I reviewed stroke education, stroke prevention, and encourage healthy lifestyles. Both sisters at bedside agree and will encourage. No other concerns voice.   Ave Filter, RN

## 2014-03-05 NOTE — Progress Notes (Signed)
I met with pt at bedside and then contacted his sister, Inez Catalina , by phone. She works 9 hrs per day in [place] and pt has to be independent to return home. I do not have an inpt rehab bed available for this pt and she is aware . I discussed the need to search for other rehab venue options. She and pt are in agreement. I will discuss with RN CM and SW. [phone]

## 2014-03-05 NOTE — Consult Note (Signed)
Admit date: 03/03/2014 Referring Physician  Dr. 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 Echocardiogram personally viewed. Prior echocardiogram did not clearly display this finding.  There is no complaint of anginal symptoms, chest pain, shortness of breath.    PMH:   Past Medical History  Diagnosis Date  . Stroke   . Hypertension   . Prostate cancer     PSH:   Past Surgical History  Procedure Laterality Date  . Rotator cuff repair    . Prostate cacner     Allergies:  Naproxen Prior to Admit Meds:   Prior to Admission medications   Medication Sig Start Date End Date Taking? Authorizing Provider  amLODipine (NORVASC) 10 MG tablet Take 10 mg by mouth daily.   Yes Historical Provider, MD  aspirin EC 81 MG tablet Take 81 mg by mouth daily.   Yes Historical Provider, MD  clopidogrel (PLAVIX) 75 MG tablet Take 1 tablet (75 mg total) by mouth daily. 11/30/13  Yes Donne Hazel, MD  pravastatin (PRAVACHOL) 80 MG tablet Take 40 mg by mouth daily.   Yes Historical Provider, MD  sertraline (ZOLOFT) 100 MG tablet Take 50 mg by mouth daily.   Yes Historical Provider, MD   Fam HX:    Family History  Problem Relation Age of Onset  . CAD Mother   . Diabetes Mellitus II Mother   . Diabetes Mellitus II Brother   . Stroke Paternal Uncle    Social HX:    History   Social History  . Marital Status: Single    Spouse Name: N/A    Number of Children: N/A  . Years of Education: N/A   Occupational History  . Not on file.   Social History Main Topics  . Smoking status: Former Research scientist (life sciences)  .  Smokeless tobacco: Not on file  . Alcohol Use: No  . Drug Use: No  . Sexual Activity: Not on file   Other Topics Concern  . Not on file   Social History Narrative     ROS: denies chest pain, shortness of breath, syncope, palpitations. Positive for stroke, hemiparesis, difficulty with speech. All 11 ROS were addressed and are negative except what is stated in the HPI   Physical Exam: Blood pressure 126/90, pulse 99, temperature 98.9 F (37.2 C), temperature source Oral, resp. rate 18, height 5\' 9"  (1.753 m), weight 157 lb 9.6 oz (71.487 kg), SpO2 98 %.   General: Well developed, well nourished, in no acute distress Head: Eyes PERRLA, No xanthomas.   Normal cephalic and atramatic  Lungs:   Clear bilaterally to auscultation and percussion. Normal respiratory effort. No wheezes, no rales. Heart:   HRRR S1 S2 Pulses are 2+ & equal. No murmur, rubs, gallops.  No carotid bruit. No JVD.  No abdominal bruits.  Abdomen: Bowel sounds are positive, abdomen soft and non-tender without masses. No hepatosplenomegaly. Msk:  Weakness noted Extremities:  No clubbing, cyanosis or edema.  DP +1 Neuro: Alert hemiparesis noted, aphasia GU: Deferred Rectal: Deferred Psych:  Good affect, responds appropriately, Speech slightly slowed.      Labs: Lab Results  Component Value Date   WBC 4.3 03/04/2014  HGB 14.0 03/04/2014   HCT 42.1 03/04/2014   MCV 83.9 03/04/2014   PLT 238 03/04/2014     Recent Labs Lab 03/04/14 0449  NA 137  K 4.3  CL 101  CO2 23  BUN 13  CREATININE 1.03  CALCIUM 9.4  PROT 7.0  BILITOT 0.2*  ALKPHOS 107  ALT 57*  AST 57*  GLUCOSE 80   No results for input(s): CKTOTAL, CKMB, TROPONINI in the last 72 hours. Lab Results  Component Value Date   CHOL 137 03/04/2014   HDL 50 03/04/2014   LDLCALC 58 03/04/2014   TRIG 146 03/04/2014   No results found for: DDIMER   Radiology:  Dg Thoracic Spine W/swimmers  03/03/2014   CLINICAL DATA:  Status post fall 1 week  ago now with onset of incontinence of urine and stool beginning yesterday ; history of previous CVA  EXAM: THORACIC SPINE - 2 VIEW + SWIMMERS  COMPARISON:  PA and lateral chest x-rays of November 28, 2013  FINDINGS: The thoracic vertebral bodies are preserved in height. The intervertebral disc space heights are reasonably well maintained. There is gentle S-shaped thoracolumbar curvature. There are no abnormal paravertebral soft tissue densities. There is a bridging osteophyte on the left at T9-T10.  IMPRESSION: There is no evidence of an acute compression fracture of the thoracic spine nor other acute thoracic spine abnormality.   Electronically Signed   By: David  Martinique   On: 03/03/2014 11:56   Dg Lumbar Spine Complete  03/03/2014   CLINICAL DATA:  Fall 1 week ago with trauma to head. The patient is now in kind at the bowel and bladder.  EXAM: LUMBAR SPINE - COMPLETE 4+ VIEW  COMPARISON:  None.  FINDINGS: Five non rib-bearing lumbar type vertebral bodies are present. Vertebral body heights are normal. There is slight anterolisthesis at L4-5 and L5-S1. Degenerative facet changes are present in the lower lumbar spine. Slight anterolisthesis is noted at L4-5. Alignment is otherwise anatomic. Atherosclerotic calcifications are present in the aorta and branch vessels without aneurysm.  IMPRESSION: 1. Degenerative changes in the lower lumbar spine, particularly at L4-5 and L5-S1. 2. No acute abnormality. 3. Atherosclerosis.   Electronically Signed   By: Lawrence Santiago M.D.   On: 03/03/2014 11:56   Ct Head Wo Contrast  03/03/2014   CLINICAL DATA:  Head injury related to fall 1 week ago. Initial encounter  EXAM: CT HEAD WITHOUT CONTRAST  TECHNIQUE: Contiguous axial images were obtained from the base of the skull through the vertex without intravenous contrast.  COMPARISON:  02/03/2013  FINDINGS: Skull and Sinuses:Negative for fracture or destructive process. The mastoids, middle ears, and imaged paranasal sinuses are  clear.  Orbits: No acute abnormality.  Brain: No evidence of acute abnormality, such as acute infarction, hemorrhage, hydrocephalus, or mass lesion/mass effect.  Newly visible by CT, but remote, there is a small infarct involving the cortex and subcortical white matter of the high and posterior right frontal lobe. This was noted on brain MRI 11/28/2013. There is extensive remote small vessel ischemic injury to the deep gray nuclei and deep white matter tracts. The pattern is stable from 2014, with infarcts involving the bilateral putamen, bilateral internal capsule, right caudate head, and periventricular white matter. Generalized brain atrophy.  IMPRESSION: 1. No acute intracranial findings. 2. Extensive remote ischemic injury, as described above.   Electronically Signed   By: Jorje Guild M.D.   On: 03/03/2014 11:32   [name] Head Wo Contrast  03/03/2014  CLINICAL DATA:  Bilateral leg weakness.  History of stroke  EXAM: MRI HEAD WITHOUT CONTRAST  MRA HEAD WITHOUT CONTRAST  TECHNIQUE: Multiplanar, multiecho pulse sequences of the brain and surrounding structures were obtained without intravenous contrast. Angiographic images of the head were obtained using MRA technique without contrast.  COMPARISON:  CT 03/03/2014.  MRI 11/28/2013  FINDINGS: MRI HEAD FINDINGS  Image quality degraded by motion. The patient had increasingly difficult time holding still as the study progressed.  Moderate atrophy. Extensive chronic ischemic change. Chronic infarcts are present in the basal ganglia bilaterally, right greater than left. Chronic ischemia throughout the cerebral white matter right greater than left.  Small area of acute infarct in the left parietal white matter measuring 5 x 10 mm. Possible small area of acute infarct in the cingulate gyrus near the corpus callosum in the left frontal lobe. Small area of acute infarct in the left internal capsule.  Improvement in restricted diffusion in a sulcus on the right. This  likely is an area of prior hemorrhage.  Negative for mass or edema.  Negative for hydrocephalus.  MRA HEAD FINDINGS  Image quality is significantly degraded by motion.  Left vertebral artery patent to the basilar. Right vertebral artery is severely diseased. Mild stenosis in the basilar. Superior cerebellar and posterior cerebral arteries are patent. Moderate disease in the left posterior cerebral artery .  Internal carotid artery patent bilaterally. Anterior and middle cerebral arteries are poorly visualized due to motion. There appears to be a severe stenosis of the right M1 segment. Mild disease in the left M1 segment. Both anterior cerebral arteries are patent and poorly evaluated on this study.  IMPRESSION: Small areas of acute infarct involving the left parietal white matter, left cingulate gyrus, and left internal capsule.  Advanced chronic ischemic change right greater than left. Prior hemorrhage on the right is noted with improvement.  MRA is severely limited due to motion. There is a probable severe stenosis in the right M1 segment and mild disease in the left M1 segment. Severe disease distal right vertebral artery. Moderate disease in the left posterior cerebral artery.   Electronically Signed   By: Franchot Gallo M.D.   On: 03/03/2014 19:09   Mr Brain Wo Contrast  03/03/2014   CLINICAL DATA:  Bilateral leg weakness.  History of stroke  EXAM: MRI HEAD WITHOUT CONTRAST  MRA HEAD WITHOUT CONTRAST  TECHNIQUE: Multiplanar, multiecho pulse sequences of the brain and surrounding structures were obtained without intravenous contrast. Angiographic images of the head were obtained using MRA technique without contrast.  COMPARISON:  CT 03/03/2014.  MRI 11/28/2013  FINDINGS: MRI HEAD FINDINGS  Image quality degraded by motion. The patient had increasingly difficult time holding still as the study progressed.  Moderate atrophy. Extensive chronic ischemic change. Chronic infarcts are present in the basal ganglia  bilaterally, right greater than left. Chronic ischemia throughout the cerebral white matter right greater than left.  Small area of acute infarct in the left parietal white matter measuring 5 x 10 mm. Possible small area of acute infarct in the cingulate gyrus near the corpus callosum in the left frontal lobe. Small area of acute infarct in the left internal capsule.  Improvement in restricted diffusion in a sulcus on the right. This likely is an area of prior hemorrhage.  Negative for mass or edema.  Negative for hydrocephalus.  MRA HEAD FINDINGS  Image quality is significantly degraded by motion.  Left vertebral artery patent to the basilar. Right vertebral artery  is severely diseased. Mild stenosis in the basilar. Superior cerebellar and posterior cerebral arteries are patent. Moderate disease in the left posterior cerebral artery .  Internal carotid artery patent bilaterally. Anterior and middle cerebral arteries are poorly visualized due to motion. There appears to be a severe stenosis of the right M1 segment. Mild disease in the left M1 segment. Both anterior cerebral arteries are patent and poorly evaluated on this study.  IMPRESSION: Small areas of acute infarct involving the left parietal white matter, left cingulate gyrus, and left internal capsule.  Advanced chronic ischemic change right greater than left. Prior hemorrhage on the right is noted with improvement.  MRA is severely limited due to motion. There is a probable severe stenosis in the right M1 segment and mild disease in the left M1 segment. Severe disease distal right vertebral artery. Moderate disease in the left posterior cerebral artery.   Electronically Signed   By: Franchot Gallo M.D.   On: 03/03/2014 19:09   Personally viewed.  EKG:  03/03/14-sinus rhythm, baseline wander, no obvious ST segment changes. Personally viewed.   Echocardiogram: 03/04/14: ejection fraction 65%, hypokinesis of basal inferior myocardium.new finding when  compared to old echocardiogram. Personally viewed.  ASSESSMENT/PLAN:    58 year old with basal inferior hypokinesis on echocardiogram with overall normal ejection fraction with recent left MCA acute ischemic stroke.  1. Wall motion abnormality on echocardiogram-basal inferior wall. Overall, given the location of this wall motion abnormality, small area, with overall normal ejection fraction this is a low risk finding. If patient were to develop anginal symptoms, it would not be unreasonable to proceed with nuclear stress test however at this time I would continue with aggressive stroke, secondary risk prevention. Continue with both aspirin and statin, blood pressure control.   2. Acute stroke-per neurology/primary team    Candee Furbish, MD  03/05/2014  11:12 AM

## 2014-03-05 NOTE — Clinical Social Work Psychosocial (Signed)
Clinical Social Work Department BRIEF PSYCHOSOCIAL ASSESSMENT 03/05/2014  Patient:  Jermaine Lowery, Jermaine Lowery     Account Number:  [id number]     Admit date:  03/03/2014  Clinical Social Worker:  Marciano Sequin  Date/Time:  03/05/2014 03:50 PM  Referred by:  RN  Date Referred:  03/05/2014 Referred for  SNF Placement   Other Referral:   Interview type:  Patient Other interview type:    PSYCHOSOCIAL DATA Living Status:  ALONE Admitted from facility:   Level of care:   Primary support name:  [name],[name],[name] Primary support relationship to patient:  SIBLING Degree of support available:    CURRENT CONCERNS  Other Concerns:    SOCIAL WORK ASSESSMENT / PLAN CSW met the pt at bedside. CSW introduced self and purpose of the visit. CSW discussed the clinical team's recommendations for rehab.  Pt reported that he receive VA benefits through [facility]. CSW discussed the [place] SNF process with the pt. CSW and pt reviewed the [facility] list.  CSW provided the pt with contact information for further questions. CSW will continue to follow this pt and assist with discharge as needed.   Assessment/plan status:  Psychosocial Support/Ongoing Assessment of Needs Other assessment/ plan:   Information/referral to community resources:    PATIENT'S/FAMILY'S RESPONSE TO PLAN OF CARE: The pt presented with a flat affect and distant mood. The pt appeared to be preoccupied in thought. The pt acknowledged wanting to go to SNFs. The pt give CSW permission to send clinical information to [place].    [place], MSW, [place]

## 2014-03-05 NOTE — Clinical Documentation Improvement (Signed)
Documentation reflects prior CVA with residual/chronic left-sided hemiparesis and acute CVA with right-sided weakness.  Physical Therapy note states right -side is dominant.  Please document in your progress note and carry over to the discharge summary if you agree with right-side as dominant side.    Thank you, Mateo Flow, RN [phone] Clinical Documentation Specialist

## 2014-03-05 NOTE — Progress Notes (Addendum)
STROKE TEAM PROGRESS NOTE   HISTORY Jermaine Lowery is a 58 y.o. male with a history of gait problems and previous strokes. He had incontinence yesterday and his sister is worried about them and therefore brought him to the emergency room. He states that he feels his walking is about the same as it has been, but after going to the bathroom he was unable to get his pants open and hisunder were down prior to needing to go. He therefore wet himself. He states that he does not feel that his symptoms are significantly worse. He was last known well 03/03/2014. Time was not documented. Patient was not administered TPA secondary to delay in arrival. He was admitted for further evaluation and treatment.   SUBJECTIVE (INTERVAL HISTORY) His therapist is at the bedside.  Overall he feels his condition is unchanged. Carotid dopplers and 2DEcho ok. Rehab feel he is more appropriate for SNF   OBJECTIVE Temp:  [97.6 F (36.4 C)-98.9 F (37.2 C)] 98 F (36.7 C) (11/05 1421) Pulse Rate:  [80-99] 80 (11/05 1421) Cardiac Rhythm:  [-] Normal sinus rhythm (11/05 1421) Resp:  [18] 18 (11/05 1421) BP: (121-160)/(79-96) 127/89 mmHg (11/05 1421) SpO2:  [95 %-100 %] 100 % (11/05 1421)  No results for input(s): GLUCAP in the last 168 hours.  Recent Labs Lab 03/03/14 1025 03/04/14 0449  NA 140 137  K 4.0 4.3  CL 102 101  CO2 26 23  GLUCOSE 77 80  BUN 17 13  CREATININE 1.09 1.03  CALCIUM 9.2 9.4    Recent Labs Lab 03/04/14 0449  AST 57*  ALT 57*  ALKPHOS 107  BILITOT 0.2*  PROT 7.0  ALBUMIN 3.4*    Recent Labs Lab 03/03/14 1025 03/04/14 0618  WBC 4.4 4.3  NEUTROABS 2.4 2.1  HGB 14.1 14.0  HCT 42.9 42.1  MCV 84.1 83.9  PLT 232 238    Recent Labs Lab 03/05/14 1115  TROPONINI <0.30    Recent Labs  03/04/14 0618  LABPROT 13.2  INR 0.99    Recent Labs  03/03/14 1515  COLORURINE YELLOW  LABSPEC 1.019  PHURINE 6.0  GLUCOSEU NEGATIVE  HGBUR NEGATIVE  BILIRUBINUR NEGATIVE   KETONESUR NEGATIVE  PROTEINUR NEGATIVE  UROBILINOGEN 0.2  NITRITE NEGATIVE  LEUKOCYTESUR NEGATIVE       Component Value Date/Time   CHOL 137 03/04/2014 0449   TRIG 146 03/04/2014 0449   HDL 50 03/04/2014 0449   CHOLHDL 2.7 03/04/2014 0449   VLDL 29 03/04/2014 0449   LDLCALC 58 03/04/2014 0449   Lab Results  Component Value Date   HGBA1C 5.9* 03/04/2014      Component Value Date/Time   LABOPIA NONE DETECTED 02/04/2013 1556   COCAINSCRNUR NONE DETECTED 02/04/2013 1556   LABBENZ NONE DETECTED 02/04/2013 1556   AMPHETMU NONE DETECTED 02/04/2013 1556   THCU NONE DETECTED 02/04/2013 1556   LABBARB NONE DETECTED 02/04/2013 1556    No results for input(s): ETH in the last 168 hours.  [name] Head Wo Contrast  03/03/2014   CLINICAL DATA:  Bilateral leg weakness.  History of stroke  EXAM: MRI HEAD WITHOUT CONTRAST  MRA HEAD WITHOUT CONTRAST  TECHNIQUE: Multiplanar, multiecho pulse sequences of the brain and surrounding structures were obtained without intravenous contrast. Angiographic images of the head were obtained using MRA technique without contrast.  COMPARISON:  CT 03/03/2014.  MRI 11/28/2013  FINDINGS: MRI HEAD FINDINGS  Image quality degraded by motion. The patient had increasingly difficult time holding still as the  study progressed.  Moderate atrophy. Extensive chronic ischemic change. Chronic infarcts are present in the basal ganglia bilaterally, right greater than left. Chronic ischemia throughout the cerebral white matter right greater than left.  Small area of acute infarct in the left parietal white matter measuring 5 x 10 mm. Possible small area of acute infarct in the cingulate gyrus near the corpus callosum in the left frontal lobe. Small area of acute infarct in the left internal capsule.  Improvement in restricted diffusion in a sulcus on the right. This likely is an area of prior hemorrhage.  Negative for mass or edema.  Negative for hydrocephalus.  MRA HEAD FINDINGS   Image quality is significantly degraded by motion.  Left vertebral artery patent to the basilar. Right vertebral artery is severely diseased. Mild stenosis in the basilar. Superior cerebellar and posterior cerebral arteries are patent. Moderate disease in the left posterior cerebral artery .  Internal carotid artery patent bilaterally. Anterior and middle cerebral arteries are poorly visualized due to motion. There appears to be a severe stenosis of the right M1 segment. Mild disease in the left M1 segment. Both anterior cerebral arteries are patent and poorly evaluated on this study.  IMPRESSION: Small areas of acute infarct involving the left parietal white matter, left cingulate gyrus, and left internal capsule.  Advanced chronic ischemic change right greater than left. Prior hemorrhage on the right is noted with improvement.  MRA is severely limited due to motion. There is a probable severe stenosis in the right M1 segment and mild disease in the left M1 segment. Severe disease distal right vertebral artery. Moderate disease in the left posterior cerebral artery.   Electronically Signed   By: Franchot Gallo M.D.   On: 03/03/2014 19:09   Mr Brain Wo Contrast  03/03/2014   CLINICAL DATA:  Bilateral leg weakness.  History of stroke  EXAM: MRI HEAD WITHOUT CONTRAST  MRA HEAD WITHOUT CONTRAST  TECHNIQUE: Multiplanar, multiecho pulse sequences of the brain and surrounding structures were obtained without intravenous contrast. Angiographic images of the head were obtained using MRA technique without contrast.  COMPARISON:  CT 03/03/2014.  MRI 11/28/2013  FINDINGS: MRI HEAD FINDINGS  Image quality degraded by motion. The patient had increasingly difficult time holding still as the study progressed.  Moderate atrophy. Extensive chronic ischemic change. Chronic infarcts are present in the basal ganglia bilaterally, right greater than left. Chronic ischemia throughout the cerebral white matter right greater than left.   Small area of acute infarct in the left parietal white matter measuring 5 x 10 mm. Possible small area of acute infarct in the cingulate gyrus near the corpus callosum in the left frontal lobe. Small area of acute infarct in the left internal capsule.  Improvement in restricted diffusion in a sulcus on the right. This likely is an area of prior hemorrhage.  Negative for mass or edema.  Negative for hydrocephalus.  MRA HEAD FINDINGS  Image quality is significantly degraded by motion.  Left vertebral artery patent to the basilar. Right vertebral artery is severely diseased. Mild stenosis in the basilar. Superior cerebellar and posterior cerebral arteries are patent. Moderate disease in the left posterior cerebral artery .  Internal carotid artery patent bilaterally. Anterior and middle cerebral arteries are poorly visualized due to motion. There appears to be a severe stenosis of the right M1 segment. Mild disease in the left M1 segment. Both anterior cerebral arteries are patent and poorly evaluated on this study.  IMPRESSION: Small areas of acute  infarct involving the left parietal white matter, left cingulate gyrus, and left internal capsule.  Advanced chronic ischemic change right greater than left. Prior hemorrhage on the right is noted with improvement.  MRA is severely limited due to motion. There is a probable severe stenosis in the right M1 segment and mild disease in the left M1 segment. Severe disease distal right vertebral artery. Moderate disease in the left posterior cerebral artery.   Electronically Signed   By: Franchot Gallo M.D.   On: 03/03/2014 19:09     PHYSICAL EXAM Frail elderly african [place] male not in distress.Awake alert. Afebrile. Head is nontraumatic. Neck is supple without bruit. Hearing is normal. Cardiac exam no murmur or gallop. Lungs are clear to auscultation. Distal pulses are well felt. Neurological Exam : awake alert oriented x3. Speech is slightly hesitant and slow but  without dysarthria or aphasia. Extraocular movements are full range but there is saccadic dysmetria on lateral gaze left more than right. No nystagmus. Fundi were not visualized. Vision acuity seems adequate. Face is symmetric without weakness. Tongue is midline. Jaw jerk is brisk. Motor system exam revealed no upper or lower extremity drift. Mild weakness of left grip and intrinsic hand muscles. Orbits right over left upper extremity. Deep tendon reflexes are 3+ brisk bilaterally. Plantars are both equivocal. Sensation appears preserved bilaterally. Gait was not tested.   ASSESSMENT/PLAN Mr. Jermaine Lowery is a 58 y.o. male with history of prior stroke and hypertension presenting with progressive difficulty walking and doing ADLs.Marland Kitchen He did not receive IV t-PA due to delay in arrival.   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 Severe disease distal right vertebral artery. Moderate disease left posterior cerebral artery  Carotid Doppler  1-39 % bilateral ICA stenosis  2D Echo  Left ventricle: The cavity size was normal. Wall thickness was normal. Systolic function was normal. The estimated ejection fraction was in the range of 60% to 65%. There is hypokinesis of the  basalinferior myocardium.  HgbA1c 6.0  In July  Lovenox 40 mg sq daily for VTE prophylaxis  Diet Heart thin liquids  aspirin 81 mg orally every day and clopidogrel 75 mg orally every day prior to admission, now on aspirin 81 mg orally every day and clopidogrel 75 mg orally every day. Recommend ongoing daily plavix and discontinue aspirin due to long-term risk of cerebral hemorrhage in the absence of cardiac disease.  Patient counseled to be  compliant with his antithrombotic medications  Ongoing aggressive risk factor management  Therapy recommendations:  CIR  Disposition:  CIR. Consult placed  Hypertension  Stable  Hyperlipidemia  Home meds:  Pravachol, resumed in hospital  LDL 58 goal < 70  Continue statin at discharge  Other Stroke Risk Factors Former Cigarette smoker, advised to stop smoking . Hx stroke/TIA  Hospital day # 2  Burnetta Sabin, MSN, APRN, ANVP-BC, AGPCNP-BC [facility] Pager: [phone] 03/05/2014 3:13 PM   I have personally examined this patient, reviewed notes, independently viewed imaging studies, participated in medical decision making and plan of care. I have made any additions or clarifications directly to the above note. Agree with note above. Patient has multiple tiny infarct due to small vessel disease plus prior study of hemorrhage  and cerebrovascular disease as well with clinical exam compatible with pseudobulbar state.Stroke team will sign off Call for questions.  Antony Contras, MD Medical Director [facility] Pager: [phone] 03/05/2014 3:13 PM   To contact Stroke Continuity provider, please refer to [email or web address] After hours, contact General Neurology

## 2014-03-06 DIAGNOSIS — R931 Abnormal findings on diagnostic imaging of heart and coronary circulation: Secondary | ICD-10-CM

## 2014-03-06 LAB — TROPONIN I

## 2014-03-06 LAB — ABO/RH: ABO/RH(D): O POS

## 2014-03-06 LAB — TYPE AND SCREEN
ABO/RH(D): O POS
Antibody Screen: NEGATIVE

## 2014-03-06 LAB — HEMOGLOBIN AND HEMATOCRIT, BLOOD
HCT: 47.6 % (ref 39.0–52.0)
HEMOGLOBIN: 15.7 g/dL (ref 13.0–17.0)

## 2014-03-06 MED ORDER — CLOPIDOGREL BISULFATE 75 MG PO TABS: 75.0000 mg | ORAL_TABLET | Freq: Every day | ORAL | Status: DC

## 2014-03-06 MED ORDER — TAMSULOSIN HCL 0.4 MG PO CAPS: 0.4000 mg | ORAL_CAPSULE | Freq: Every day | ORAL | Status: AC

## 2014-03-06 MED ORDER — CLOPIDOGREL BISULFATE 75 MG PO TABS
75.0000 mg | ORAL_TABLET | Freq: Every day | ORAL | Status: DC
  Administered 2014-03-06: 75 mg via ORAL
  Filled 2014-03-06: qty 1

## 2014-03-06 NOTE — Progress Notes (Signed)
Physical Therapy Treatment Patient Details Name: Jermaine Lowery MRN: [id number] DOB: 04/23/1956 Today's Date: 03/06/2014    History of Present Illness 58 y.o. male admitted on 03/03/14 after recent falls, urinary and bowel retention, and incontinence. MRI showed small infarcts L parietal, L frontal, and L internal capsule. 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 Progressing well with ambulation until he began bleeding from his rectum. (Had discussed with Daphne, RN prior to session and she felt it had stopped from earlier today and was okay to ambulate). Would like to complete Berg Balance Assessment on next visit to compare to evaluation.   Follow Up Recommendations  SNF;Supervision/Assistance - 24 hour     Equipment Recommendations  3in1 (PT)    Recommendations for Other Services       Precautions / Restrictions Precautions Precautions: Fall Precaution Comments: reports he has fallen twice in last few months (?accuracy) Restrictions Weight Bearing Restrictions: No    Mobility  Bed Mobility                  Transfers Overall transfer level: Needs assistance Equipment used: Rolling walker (2 wheeled) Transfers: Sit to/from Stand Sit to Stand: Min assist         General transfer comment: repeated x 4; Min (A) to stabilize balance upon standing. Verbal cues for safe RW use and management.  Ambulation/Gait Ambulation/Gait assistance: Min guard Ambulation Distance (Feet): 110 Feet Assistive device: Rolling walker (2 wheeled) Gait Pattern/deviations: Step-through pattern;Decreased stride length;Wide base of support   Gait velocity interpretation: Below normal speed for age/gender General Gait Details: vc for proximity to RW and to adjust his Lt hand placement (drifting backwards off hand grip while walking); wide turns with cues to stay inside RW and for  making tighter turn for small spaces   Stairs            Wheelchair Mobility    Modified Rankin (Stroke Patients Only) Modified Rankin (Stroke Patients Only) Pre-Morbid Rankin Score: Moderate disability Modified Rankin: Moderately severe disability     Balance                                    Cognition Arousal/Alertness: Awake/alert Behavior During Therapy: Flat affect Overall Cognitive Status: No family/caregiver present to determine baseline cognitive functioning                      Exercises      General Comments General comments (skin integrity, edema, etc.): Unable to complete repeat Berg this session as pt began to have rectal bleeding while ambulating in hallway. RN came to assist and staff brought pt's recliner with returning pt to sit and rolled back to his room. Assisted RN with cleaning pt's legs and changing his gown and applying a "brief" (mesh brief with folded absorbent pad to contain bleeding). Stood x3 while doffing his briefs and donning new clothes. No significant posterior lean noted, however was unsteady.       Pertinent Vitals/Pain Pain Assessment: No/denies pain    Home Living                      Prior Function            PT Goals (current goals can now be found  in the care plan section) Acute Rehab PT Goals Patient Stated Goal: likes to watch TV "football"- wants [employer] to win Progress towards PT goals: Progressing toward goals    Frequency  Min 3X/week    PT Plan Current plan remains appropriate    Co-evaluation             End of Session Equipment Utilized During Treatment: Gait belt Activity Tolerance: Treatment limited secondary to medical complications (Comment) (rectal bleeding) Patient left:  (in bathroom with nurse tech present)     Time: 0156-1537 PT Time Calculation (min): 35 min  Charges:  $Gait Training: 23-37 mins                    G Codes:      Lorik Guo 2014/03/07,  3:11 PM Pager [phone]

## 2014-03-06 NOTE — Progress Notes (Signed)
Speech Language Pathology Treatment: Cognitive-Linquistic  Patient Details Name: Jermaine Lowery MRN: [id number] DOB: 03/05/1956 Today's Date: 03/06/2014 Time: 1206-1223 SLP Time Calculation (min): 17 min  Assessment / Plan / Recommendation Clinical Impression  Pt was seen for cognitive treatment during lunch meal. Pt required Min-Mod cues for initiation, however was then able to sustain attention to self-feeding task for almost 15 minutes with Mod I. He was independently oriented x4. Pt required Max A from SLP for decision-making during meal set-up, needing options presented to him one at a time in order for him to respond in a yes/no manner. Continue to recommend f/u SLP and 24/7 supervision upon discharge.   HPI HPI: 58 y.o. male admitted on 03/03/14 after recent falls, urinary and bowel retention, and incontinence. MRI showed small infarcts L parietal, L frontal, and L internal capsule. 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 CCC-SLP [phone]  Jermaine Lowery 03/06/2014, 12:29 PM

## 2014-03-06 NOTE — Discharge Instructions (Signed)
Follow with Primary MD MIRANDA, HARVEY, MD in 7 days   Get CBC, CMP, 2 view Chest X ray checked  by Primary MD next visit.    Activity: As tolerated with Full fall precautions use walker/cane & assistance as needed   Disposition home with HHPT    Diet: Heart Healthy   with feeding assistance and aspiration precautions as needed.  For Heart failure patients - Check your Weight same time everyday, if you gain over 2 pounds, or you develop in leg swelling, experience more shortness of breath or chest pain, call your Primary MD immediately. Follow Cardiac Low Salt Diet and 1.8 lit/day fluid restriction.   On your next visit with your primary care physician please Get Medicines reviewed and adjusted.   Please request your Prim.MD to go over all Hospital Tests and Procedure/Radiological results at the follow up, please get all Hospital records sent to your Prim MD by signing hospital release before you go home.   If you experience worsening of your admission symptoms, develop shortness of breath, life threatening emergency, suicidal or homicidal thoughts you must seek medical attention immediately by calling 911 or calling your MD immediately  if symptoms less severe.  You Must read complete instructions/literature along with all the possible adverse reactions/side effects for all the Medicines you take and that have been prescribed to you. Take any new Medicines after you have completely understood and accpet all the possible adverse reactions/side effects.   Do not drive, operating heavy machinery, perform activities at heights, swimming or participation in water activities or provide baby sitting services if your were admitted for syncope or siezures until you have seen by Primary MD or a Neurologist and advised to do so again.  Do not drive when taking Pain medications.    Do not take more than prescribed Pain, Sleep and Anxiety Medications  Special Instructions: If you have smoked or  chewed Tobacco  in the last 2 yrs please stop smoking, stop any regular Alcohol  and or any Recreational drug use.  Wear Seat belts while driving.   Please note  You were cared for by a hospitalist during your hospital stay. If you have any questions about your discharge medications or the care you received while you were in the hospital after you are discharged, you can call the unit and asked to speak with the hospitalist on call if the hospitalist that took care of you is not available. Once you are discharged, your primary care physician will handle any further medical issues. Please note that NO REFILLS for any discharge medications will be authorized once you are discharged, as it is imperative that you return to your primary care physician (or establish a relationship with a primary care physician if you do not have one) for your aftercare needs so that they can reassess your need for medications and monitor your lab values.

## 2014-03-06 NOTE — Clinical Social Work Placement (Addendum)
Clinical Social Work Department CLINICAL SOCIAL WORK PLACEMENT NOTE 03/05/2014  Patient:  Jermaine Lowery, Jermaine Lowery  Account Number:  [id number] Admit date:  03/03/2014  Clinical Social Worker:  Greta Doom, LCSWA  Date/time:  03/05/2014 02:39 PM  Clinical Social Work is seeking post-discharge placement for this patient at the following level of care:   SKILLED NURSING   (*CSW will update this form in Epic as items are completed)   03/05/2014  Patient/family provided with [facility] Department of Clinical Social Work's list of facilities offering this level of care within the geographic area requested by the patient (or if unable, by the patient's family).  03/05/2014  Patient/family informed of their freedom to choose among providers that offer the needed level of care, that participate in Medicare, Medicaid or managed care program needed by the patient, have an available bed and are willing to accept the patient.  03/05/2014  Patient/family informed of [facility]' ownership interest in [facility], as well as of the fact that they are under no obligation to receive care at this facility.  PASARR submitted to EDS on 03/06/2014 PASARR number received on 03/06/2014  FL2 transmitted to all facilities in geographic area requested by pt/family on  03/06/2014 FL2 transmitted to all facilities within larger geographic area on 03/06/2014  Patient informed that his/her managed care company has contracts with or will negotiate with  certain facilities, including the following:     Patient/family informed of bed offers received: 03/06/2014   Patient chooses bed at [facility]  Physician recommends and patient chooses bed at    Patient to be transferred to on   Patient to be transferred to facility by  Patient and family notified of transfer on  Name of family member notified:    The following physician request were entered in Epic:   Additional Comments:   [place], MSW, [place]

## 2014-03-06 NOTE — Progress Notes (Addendum)
Patient Demographics  Jermaine Lowery, is a 58 y.o. male, DOB - 10/01/1955, OIB:[id number]  Admit date - 03/03/2014   Admitting Physician Berle Mull, MD  Outpatient Primary MD for the patient is [name], MD  LOS - 3   Chief Complaint  Patient presents with  . Urinary Incontinence        Subjective:   Jermaine Lowery today has, No headache, No chest pain, No abdominal pain - No Nausea, No new weakness tingling or numbness, No Cough - SOB.    Assessment & Plan    1. 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 Continue monitoring on tele, obtain PT, OT, speech input, A1c and lipid panel, noted MRI MRA brain, nonacute Echogram and carotid duplex, neurology wants Plavix only at this time.  Lab Results  Component Value Date   HGBA1C 5.9* 03/04/2014    Lab Results  Component Value Date   CHOL 137 03/04/2014   HDL 50 03/04/2014   LDLCALC 58 03/04/2014   TRIG 146 03/04/2014   CHOLHDL 2.7 03/04/2014     2. Urinary retention. Resolved on Flomax, has developed some hematuria and clots, stop Lovenox, skip Plavix x 1 day , monitor postvoid bladder scan, will need outpatient urology follow-up post discharge. Consulted urology Dr Gaynelle Arabian.   3. Dyslipidemia. Continue home dose statin.   4. Essential hypertension.on Norvasc continue.    5. Wall motion abnormality noted on Echo - new as compared to echogram few months ago, he is chest pain-free, EKG nonacute, check one set of troponin, continue aspirin and statin. Cardiology consulted, outpatient follow-up.    Code Status: full  Family Communication: none  present  Disposition Plan: CIR versus rehabilitation   Procedures CT head, MRI/MRA brain, echo, carotid duplex   Consults  Neuro, cardiology, urology tannenbaum   Medications  Scheduled Meds: . amLODipine  10 mg Oral Daily  . clopidogrel  75 mg Oral Daily  . pravastatin  40 mg Oral Daily  . sertraline  50 mg Oral Daily  . tamsulosin  0.4 mg Oral QHS   Continuous Infusions:  PRN Meds:.acetaminophen, senna-docusate  DVT Prophylaxis  SCD   Lab Results  Component Value Date   PLT 238 03/04/2014    Antibiotics    Anti-infectives    None          Objective:   Filed Vitals:   03/06/14 0207 03/06/14 0526 03/06/14 0920 03/06/14 1030  BP: 143/93 143/85 130/100 131/92  Pulse: 90 88 100 90  Temp: 97.9 F (36.6 C) 97.9 F (36.6 C)  98.1 F (36.7 C)  TempSrc: Oral Oral  Oral  Resp: 18  16 18   Height:      Weight:      SpO2: 98% 98%  100%    Wt Readings from Last 3 Encounters:  03/04/14 71.487 kg (157 lb 9.6 oz)  11/28/13 70.398 kg (155 lb 3.2 oz)  02/03/13 75.161 kg (165 lb 11.2 oz)     Intake/Output Summary (Last 24 hours) at 03/06/14 1337 Last data filed at 03/06/14 0854  Gross per 24 hour  Intake    960 ml  Output      0 ml  Net    960 ml     Physical Exam  Awake Alert, Oriented X 3, No new F.N deficits, has chronic left-sided weakness, mild right-sided weakness now, flat affect Wann.AT,PERRAL Supple Neck,No JVD, No cervical lymphadenopathy appriciated.  Symmetrical Chest wall movement, Good air movement bilaterally, CTAB RRR,No Gallops,Rubs or new Murmurs, No Parasternal Heave +ve B.Sounds, Abd Soft, No tenderness, No organomegaly appriciated, No rebound - guarding or rigidity. No Cyanosis, Clubbing or edema, No new Rash or bruise     Data Review   Micro Results No results found for this or any previous visit (from the past 240 hour(s)).  Radiology Reports   Carotids Pending    TTE  - Left ventricle: The cavity size was normal.  Wall thickness was normal. Systolic function was normal. The estimated ejection fraction was in the range of 60% to 65%. There is hypokinesis of the basalinferior myocardium. Doppler parameters are consistent with abnormal left ventricular relaxation (grade 1 diastolic dysfunction).  Impressions:  - No cardiac source of emboli was indentified.      Dg Thoracic Spine W/swimmers  03/03/2014   CLINICAL DATA:  Status post fall 1 week ago now with onset of incontinence of urine and stool beginning yesterday ; history of previous CVA  EXAM: THORACIC SPINE - 2 VIEW + SWIMMERS  COMPARISON:  PA and lateral chest x-rays of November 28, 2013  FINDINGS: The thoracic vertebral bodies are preserved in height. The intervertebral disc space heights are reasonably well maintained. There is gentle S-shaped thoracolumbar curvature. There are no abnormal paravertebral soft tissue densities. There is a bridging osteophyte on the left at T9-T10.  IMPRESSION: There is no evidence of an acute compression fracture of the thoracic spine nor other acute thoracic spine abnormality.   Electronically Signed   By: David  Martinique   On: 03/03/2014 11:56   Dg Lumbar Spine Complete  03/03/2014   CLINICAL DATA:  Fall 1 week ago with trauma to head. The patient is now in kind at the bowel and bladder.  EXAM: LUMBAR SPINE - COMPLETE 4+ VIEW  COMPARISON:  None.  FINDINGS: Five non rib-bearing lumbar type vertebral bodies are present. Vertebral body heights are normal. There is slight anterolisthesis at L4-5 and L5-S1. Degenerative facet changes are present in the lower lumbar spine. Slight anterolisthesis is noted at L4-5. Alignment is otherwise anatomic. Atherosclerotic calcifications are present in the aorta and branch vessels without aneurysm.  IMPRESSION: 1. Degenerative changes in the lower lumbar spine, particularly at L4-5 and L5-S1. 2. No acute abnormality. 3. Atherosclerosis.   Electronically Signed   By: Lawrence Santiago M.D.    On: 03/03/2014 11:56   Ct Head Wo Contrast  03/03/2014   CLINICAL DATA:  Head injury related to fall 1 week ago. Initial encounter  EXAM: CT HEAD WITHOUT CONTRAST  TECHNIQUE: Contiguous axial images were obtained from the base of the skull through the vertex without intravenous contrast.  COMPARISON:  02/03/2013  FINDINGS: Skull and Sinuses:Negative for fracture or destructive process. The mastoids, middle ears, and imaged paranasal sinuses are clear.  Orbits: No acute abnormality.  Brain: No evidence of acute abnormality, such as acute infarction, hemorrhage, hydrocephalus, or mass lesion/mass effect.  Newly visible by CT, but remote, there is a small infarct involving the cortex and subcortical white matter of the high and posterior right frontal lobe. This was noted on brain MRI 11/28/2013. There  is extensive remote small vessel ischemic injury to the deep gray nuclei and deep white matter tracts. The pattern is stable from 2014, with infarcts involving the bilateral putamen, bilateral internal capsule, right caudate head, and periventricular white matter. Generalized brain atrophy.  IMPRESSION: 1. No acute intracranial findings. 2. Extensive remote ischemic injury, as described above.   Electronically Signed   By: Jorje Guild M.D.   On: 03/03/2014 11:32   [name] Head Wo Contrast  03/03/2014   CLINICAL DATA:  Bilateral leg weakness.  History of stroke  EXAM: MRI HEAD WITHOUT CONTRAST  MRA HEAD WITHOUT CONTRAST  TECHNIQUE: Multiplanar, multiecho pulse sequences of the brain and surrounding structures were obtained without intravenous contrast. Angiographic images of the head were obtained using MRA technique without contrast.  COMPARISON:  CT 03/03/2014.  MRI 11/28/2013  FINDINGS: MRI HEAD FINDINGS  Image quality degraded by motion. The patient had increasingly difficult time holding still as the study progressed.  Moderate atrophy. Extensive chronic ischemic change. Chronic infarcts are present in the  basal ganglia bilaterally, right greater than left. Chronic ischemia throughout the cerebral white matter right greater than left.  Small area of acute infarct in the left parietal white matter measuring 5 x 10 mm. Possible small area of acute infarct in the cingulate gyrus near the corpus callosum in the left frontal lobe. Small area of acute infarct in the left internal capsule.  Improvement in restricted diffusion in a sulcus on the right. This likely is an area of prior hemorrhage.  Negative for mass or edema.  Negative for hydrocephalus.  MRA HEAD FINDINGS  Image quality is significantly degraded by motion.  Left vertebral artery patent to the basilar. Right vertebral artery is severely diseased. Mild stenosis in the basilar. Superior cerebellar and posterior cerebral arteries are patent. Moderate disease in the left posterior cerebral artery .  Internal carotid artery patent bilaterally. Anterior and middle cerebral arteries are poorly visualized due to motion. There appears to be a severe stenosis of the right M1 segment. Mild disease in the left M1 segment. Both anterior cerebral arteries are patent and poorly evaluated on this study.  IMPRESSION: Small areas of acute infarct involving the left parietal white matter, left cingulate gyrus, and left internal capsule.  Advanced chronic ischemic change right greater than left. Prior hemorrhage on the right is noted with improvement.  MRA is severely limited due to motion. There is a probable severe stenosis in the right M1 segment and mild disease in the left M1 segment. Severe disease distal right vertebral artery. Moderate disease in the left posterior cerebral artery.   Electronically Signed   By: Franchot Gallo M.D.   On: 03/03/2014 19:09   Mr Brain Wo Contrast  03/03/2014   CLINICAL DATA:  Bilateral leg weakness.  History of stroke  EXAM: MRI HEAD WITHOUT CONTRAST  MRA HEAD WITHOUT CONTRAST  TECHNIQUE: Multiplanar, multiecho pulse sequences of the brain  and surrounding structures were obtained without intravenous contrast. Angiographic images of the head were obtained using MRA technique without contrast.  COMPARISON:  CT 03/03/2014.  MRI 11/28/2013  FINDINGS: MRI HEAD FINDINGS  Image quality degraded by motion. The patient had increasingly difficult time holding still as the study progressed.  Moderate atrophy. Extensive chronic ischemic change. Chronic infarcts are present in the basal ganglia bilaterally, right greater than left. Chronic ischemia throughout the cerebral white matter right greater than left.  Small area of acute infarct in the left parietal white matter measuring 5  x 10 mm. Possible small area of acute infarct in the cingulate gyrus near the corpus callosum in the left frontal lobe. Small area of acute infarct in the left internal capsule.  Improvement in restricted diffusion in a sulcus on the right. This likely is an area of prior hemorrhage.  Negative for mass or edema.  Negative for hydrocephalus.  MRA HEAD FINDINGS  Image quality is significantly degraded by motion.  Left vertebral artery patent to the basilar. Right vertebral artery is severely diseased. Mild stenosis in the basilar. Superior cerebellar and posterior cerebral arteries are patent. Moderate disease in the left posterior cerebral artery .  Internal carotid artery patent bilaterally. Anterior and middle cerebral arteries are poorly visualized due to motion. There appears to be a severe stenosis of the right M1 segment. Mild disease in the left M1 segment. Both anterior cerebral arteries are patent and poorly evaluated on this study.  IMPRESSION: Small areas of acute infarct involving the left parietal white matter, left cingulate gyrus, and left internal capsule.  Advanced chronic ischemic change right greater than left. Prior hemorrhage on the right is noted with improvement.  MRA is severely limited due to motion. There is a probable severe stenosis in the right M1 segment  and mild disease in the left M1 segment. Severe disease distal right vertebral artery. Moderate disease in the left posterior cerebral artery.   Electronically Signed   By: Franchot Gallo M.D.   On: 03/03/2014 19:09     CBC  Recent Labs Lab 03/03/14 1025 03/04/14 0618  WBC 4.4 4.3  HGB 14.1 14.0  HCT 42.9 42.1  PLT 232 238  MCV 84.1 83.9  MCH 27.6 27.9  MCHC 32.9 33.3  RDW 14.1 14.2  LYMPHSABS 1.5 1.5  MONOABS 0.5 0.5  EOSABS 0.1 0.1  BASOSABS 0.0 0.0    Chemistries   Recent Labs Lab 03/03/14 1025 03/04/14 0449  NA 140 137  K 4.0 4.3  CL 102 101  CO2 26 23  GLUCOSE 77 80  BUN 17 13  CREATININE 1.09 1.03  CALCIUM 9.2 9.4  AST  --  57*  ALT  --  57*  ALKPHOS  --  107  BILITOT  --  0.2*   ------------------------------------------------------------------------------------------------------------------ estimated creatinine clearance is 79.1 mL/min (by C-G formula based on Cr of 1.03). ------------------------------------------------------------------------------------------------------------------  Recent Labs  03/04/14 0449  HGBA1C 5.9*   ------------------------------------------------------------------------------------------------------------------  Recent Labs  03/04/14 0449  CHOL 137  HDL 50  LDLCALC 58  TRIG 146  CHOLHDL 2.7   ------------------------------------------------------------------------------------------------------------------ No results for input(s): TSH, T4TOTAL, T3FREE, THYROIDAB in the last 72 hours.  Invalid input(s): FREET3 ------------------------------------------------------------------------------------------------------------------ No results for input(s): VITAMINB12, FOLATE, FERRITIN, TIBC, IRON, RETICCTPCT in the last 72 hours.  Coagulation profile  Recent Labs Lab 03/04/14 0618  INR 0.99   Lab Results  Component Value Date   HGBA1C 5.9* 03/04/2014    No results for input(s): DDIMER in the last 72  hours.  Cardiac Enzymes  Recent Labs Lab 03/05/14 1115 03/05/14 1549 03/06/14 0102  TROPONINI <0.30 <0.30 <0.30   ------------------------------------------------------------------------------------------------------------------ Invalid input(s): POCBNP     Time Spent in minutes  35   SINGH,PRASHANT K M.D on 03/06/2014 at 1:37 PM  Between 7am to 7pm - Pager - [phone]  After 7pm go to [email or web address] - password [email or web address]  And look for the night coverage person covering for me after hours  [facility] Office  [phone]

## 2014-03-06 NOTE — Progress Notes (Signed)
Talked to patient about DCP; per Soc Worker Dysheka, patient does not have a SNF benefit and wants to go home at discharge; Patient lives with his sister Inez Catalina; Patient gave permission to CM to contact his sister- no answer vm left; [place] choices offered, patient chose [facility]/ patient was active with them in 10/2013; Virgina Evener RN with [facility] called for autherization ( tele # [phone] ext [phone]  Fax# [phone]); Miranda with [facility] called for arrangements; PCP at the [facility] is Dr [name]; Mindi Slicker RN,BSN,MHA [phone]

## 2014-03-06 NOTE — Progress Notes (Signed)
Patient called for assistance to the bathroom at 1330 and writer noted bright red blood with clots saturated on the bed pad. Assisted to the bathroom and patient voided with bright red blood. MD notified. Again, patient was ambulating at 1450 with PT and started bleeding from penis. MD notified again.

## 2014-03-06 NOTE — Progress Notes (Signed)
Patient ID: Jermaine Lowery, male   DOB: 07-03-1955, 58 y.o.   MRN: [id number]    Subjective:  Aphasia no chest pain   Objective:  Filed Vitals:   03/05/14 1834 03/05/14 2137 03/06/14 0207 03/06/14 0526  BP: 132/78 159/98 143/93 143/85  Pulse: 82 75 90 88  Temp: 98.3 F (36.8 C) 98.4 F (36.9 C) 97.9 F (36.6 C) 97.9 F (36.6 C)  TempSrc: Oral Oral Oral Oral  Resp: 18 18 18    Height:      Weight:      SpO2: 100% 98% 98% 98%    Intake/Output from previous day:  Intake/Output Summary (Last 24 hours) at 03/06/14 1157 Last data filed at 03/06/14 0854  Gross per 24 hour  Intake    960 ml  Output      0 ml  Net    960 ml    Physical Exam: Affect appropriate Frail black male  HEENT: normal Neck supple with no adenopathy JVP normal no bruits no thyromegaly Lungs clear with no wheezing and good diaphragmatic motion Heart:  S1/S2 no murmur, no rub, gallop or click PMI normal Abdomen: benighn, BS positve, no tenderness, no AAA no bruit.  No HSM or HJR Distal pulses intact with no bruits No edema Right hemiparesis  Skin warm and dry    Lab Results: Basic Metabolic Panel:  Recent Labs  03/03/14 1025 03/04/14 0449  NA 140 137  K 4.0 4.3  CL 102 101  CO2 26 23  GLUCOSE 77 80  BUN 17 13  CREATININE 1.09 1.03  CALCIUM 9.2 9.4   Liver Function Tests:  Recent Labs  03/04/14 0449  AST 57*  ALT 57*  ALKPHOS 107  BILITOT 0.2*  PROT 7.0  ALBUMIN 3.4*   CBC:  Recent Labs  03/03/14 1025 03/04/14 0618  WBC 4.4 4.3  NEUTROABS 2.4 2.1  HGB 14.1 14.0  HCT 42.9 42.1  MCV 84.1 83.9  PLT 232 238   Cardiac Enzymes:  Recent Labs  03/05/14 1115 03/05/14 1549 03/06/14 0102  TROPONINI <0.30 <0.30 <0.30   Hemoglobin A1C:  Recent Labs  03/04/14 0449  HGBA1C 5.9*   Fasting Lipid Panel:  Recent Labs  03/04/14 0449  CHOL 137  HDL 50  LDLCALC 58  TRIG 146  CHOLHDL 2.7    Imaging: No results found.  Cardiac Studies:  ECG:  SR normal     Telemetry:  NSR no arrhythmia  Echo: Inferobasal hypokinesis EF 65%   Medications:   . amLODipine  10 mg Oral Daily  . clopidogrel  75 mg Oral Daily  . enoxaparin (LOVENOX) injection  40 mg Subcutaneous Q24H  . pravastatin  40 mg Oral Daily  . sertraline  50 mg Oral Daily  . tamsulosin  0.4 mg Oral QHS       Assessment/Plan:  Abnormal Echo:  Not clinically significant No chest pain , normal troponin Normal ECG with no evidence of previous inferior infarct CVA:  Plavix plan per neuro HTN on amlodipine  Chol:  Continue statin   Will sign off  Jenkins Rouge 03/06/2014, 9:21 AM

## 2014-03-06 NOTE — Progress Notes (Signed)
Noted plans for SNF. SNF is recommended for feel pt needs 24/7 supervision at d/c and this is not available. We will sign off. [phone]

## 2014-03-06 NOTE — Progress Notes (Signed)
Talked to patient's sister's [name] and Inez Catalina about DCP; patient lives with them and they cannot take care of him at home; CM talked to Photographer about placement/ safe discharge plan; Patient is agreeable to go to SNF and Soc Worker working on a facility for the patient to go to; [employer] [phone]

## 2014-03-07 LAB — HEMOGLOBIN AND HEMATOCRIT, BLOOD
HCT: 43.9 % (ref 39.0–52.0)
HEMATOCRIT: 44.1 % (ref 39.0–52.0)
Hemoglobin: 15 g/dL (ref 13.0–17.0)
Hemoglobin: 14.8 g/dL (ref 13.0–17.0)

## 2014-03-07 MED ORDER — SODIUM CHLORIDE 0.9 % IV SOLN
INTRAVENOUS | Status: DC
  Administered 2014-03-07 – 2014-03-08 (×4): via INTRAVENOUS

## 2014-03-07 NOTE — Progress Notes (Signed)
Hematuria with clots continue  Bladder  Scan 250cc N.P Lynch made aware  Pt to follow up with urologist in a.m.

## 2014-03-07 NOTE — Progress Notes (Signed)
Patient Demographics  Jermaine Lowery, is a 58 y.o. male, DOB - October 19, 1955, OEH:[id number]  Admit date - 03/03/2014   Admitting Physician Berle Mull, MD  Outpatient Primary MD for the patient is [name], MD  LOS - 4   Chief Complaint  Patient presents with  . Urinary Incontinence        Subjective:   Jermaine Lowery today has, No headache, No chest pain, No abdominal pain - No Nausea, No new weakness tingling or numbness, No Cough - SOB.    Assessment & Plan    1. 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 Continue monitoring on tele, obtain PT, OT, speech input, A1c and lipid panel, noted MRI MRA brain, nonacute Echogram and carotid duplex, neurology wants Plavix only at this time. Holding Plavix for 2 days secondary to hematuria.   Lab Results  Component Value Date   HGBA1C 5.9* 03/04/2014    Lab Results  Component Value Date   CHOL 137 03/04/2014   HDL 50 03/04/2014   LDLCALC 58 03/04/2014   TRIG 146 03/04/2014   CHOLHDL 2.7 03/04/2014      2. Urinary retention . Resolved on Flomax, never needed Foley.     3. Dyslipidemia. Continue home dose statin.    4. Essential hypertension.on Norvasc continue.     5. Wall motion abnormality noted on Echo - new as compared to echogram few months ago, he is chest pain-free, EKG nonacute, check one set of troponin, continue aspirin and statin. Cardiology consulted, outpatient follow-up.    6. Hematuria. Developed 03/06/2014, likely due to comminution of antiplatelet medications and Lovenox for DVT prophylaxis, has history of prostate resection in the  past, post void bladder scans have been stable until one hour ago when it was over 300, we'll monitor closely, H&H stable, offending medications held, urology consulted, gentle hydration  .     Code Status: full  Family Communication: none present  Disposition Plan: CIR versus rehabilitation   Procedures CT head, MRI/MRA brain, echo, carotid duplex   Consults  Neuro, cardiology, urology tannenbaum   Medications  Scheduled Meds: . amLODipine  10 mg Oral Daily  . [START ON 03/09/2014] clopidogrel  75 mg Oral Daily  . pravastatin  40 mg Oral Daily  . sertraline  50 mg Oral Daily  . tamsulosin  0.4 mg Oral QHS   Continuous Infusions:  PRN Meds:.acetaminophen, senna-docusate  DVT Prophylaxis  SCD   Lab Results  Component Value Date   PLT 238 03/04/2014    Antibiotics    Anti-infectives    None          Objective:   Filed Vitals:   03/06/14 2103 03/07/14 0155 03/07/14 0625 03/07/14 1008  BP: 125/89 132/96 124/86 100/67  Pulse: 92 88 102 118  Temp: 98.2 F (36.8 C) 97.9 F (36.6 C) 97.7 F (36.5 C) 98.1 F (36.7 C)  TempSrc: Oral Oral Oral Oral  Resp: 18 18 18 18   Height:      Weight:      SpO2: 100% 100% 100% 100%    Wt  Readings from Last 3 Encounters:  03/04/14 71.487 kg (157 lb 9.6 oz)  11/28/13 70.398 kg (155 lb 3.2 oz)  02/03/13 75.161 kg (165 lb 11.2 oz)     Intake/Output Summary (Last 24 hours) at 03/07/14 1140 Last data filed at 03/07/14 1021  Gross per 24 hour  Intake    480 ml  Output    425 ml  Net     55 ml     Physical Exam  Awake Alert, Oriented X 3, No new F.N deficits, has chronic left-sided weakness, mild right-sided weakness now, flat affect [place].AT,PERRAL Supple Neck,No JVD, No cervical lymphadenopathy appriciated.  Symmetrical Chest wall movement, Good air movement bilaterally, CTAB RRR,No Gallops,Rubs or new Murmurs, No Parasternal Heave +ve B.Sounds, Abd Soft, No tenderness, No organomegaly appriciated, No rebound -  guarding or rigidity. No Cyanosis, Clubbing or edema, No new Rash or bruise     Data Review   Micro Results No results found for this or any previous visit (from the past 240 hour(s)).  Radiology Reports   Carotids    Carotid Duplex (Doppler) has been completed. Preliminary findings: Bilateral: 1-39% ICA stenosis. Vertebral artery flow is antegrade.   TTE  - Left ventricle: The cavity size was normal. Wall thickness was normal. Systolic function was normal. The estimated ejection fraction was in the range of 60% to 65%. There is hypokinesis of the basalinferior myocardium. Doppler parameters are consistent with abnormal left ventricular relaxation (grade 1 diastolic dysfunction).  Impressions:  No cardiac source of emboli was indentified.      Dg Thoracic Spine W/swimmers  03/03/2014   CLINICAL DATA:  Status post fall 1 week ago now with onset of incontinence of urine and stool beginning yesterday ; history of previous CVA  EXAM: THORACIC SPINE - 2 VIEW + SWIMMERS  COMPARISON:  PA and lateral chest x-rays of November 28, 2013  FINDINGS: The thoracic vertebral bodies are preserved in height. The intervertebral disc space heights are reasonably well maintained. There is gentle S-shaped thoracolumbar curvature. There are no abnormal paravertebral soft tissue densities. There is a bridging osteophyte on the left at T9-T10.  IMPRESSION: There is no evidence of an acute compression fracture of the thoracic spine nor other acute thoracic spine abnormality.   Electronically Signed   By: David  Martinique   On: 03/03/2014 11:56   Dg Lumbar Spine Complete  03/03/2014   CLINICAL DATA:  Fall 1 week ago with trauma to head. The patient is now in kind at the bowel and bladder.  EXAM: LUMBAR SPINE - COMPLETE 4+ VIEW  COMPARISON:  None.  FINDINGS: Five non rib-bearing lumbar type vertebral bodies are present. Vertebral body heights are normal. There is slight anterolisthesis at L4-5 and L5-S1.  Degenerative facet changes are present in the lower lumbar spine. Slight anterolisthesis is noted at L4-5. Alignment is otherwise anatomic. Atherosclerotic calcifications are present in the aorta and branch vessels without aneurysm.  IMPRESSION: 1. Degenerative changes in the lower lumbar spine, particularly at L4-5 and L5-S1. 2. No acute abnormality. 3. Atherosclerosis.   Electronically Signed   By: Lawrence Santiago M.D.   On: 03/03/2014 11:56   Ct Head Wo Contrast  03/03/2014   CLINICAL DATA:  Head injury related to fall 1 week ago. Initial encounter  EXAM: CT HEAD WITHOUT CONTRAST  TECHNIQUE: Contiguous axial images were obtained from the base of the skull through the vertex without intravenous contrast.  COMPARISON:  02/03/2013  FINDINGS: Skull and Sinuses:Negative for fracture or  destructive process. The mastoids, middle ears, and imaged paranasal sinuses are clear.  Orbits: No acute abnormality.  Brain: No evidence of acute abnormality, such as acute infarction, hemorrhage, hydrocephalus, or mass lesion/mass effect.  Newly visible by CT, but remote, there is a small infarct involving the cortex and subcortical white matter of the high and posterior right frontal lobe. This was noted on brain MRI 11/28/2013. There is extensive remote small vessel ischemic injury to the deep gray nuclei and deep white matter tracts. The pattern is stable from 2014, with infarcts involving the bilateral putamen, bilateral internal capsule, right caudate head, and periventricular white matter. Generalized brain atrophy.  IMPRESSION: 1. No acute intracranial findings. 2. Extensive remote ischemic injury, as described above.   Electronically Signed   By: Jorje Guild M.D.   On: 03/03/2014 11:32   [name] Head Wo Contrast  03/03/2014   CLINICAL DATA:  Bilateral leg weakness.  History of stroke  EXAM: MRI HEAD WITHOUT CONTRAST  MRA HEAD WITHOUT CONTRAST  TECHNIQUE: Multiplanar, multiecho pulse sequences of the brain and  surrounding structures were obtained without intravenous contrast. Angiographic images of the head were obtained using MRA technique without contrast.  COMPARISON:  CT 03/03/2014.  MRI 11/28/2013  FINDINGS: MRI HEAD FINDINGS  Image quality degraded by motion. The patient had increasingly difficult time holding still as the study progressed.  Moderate atrophy. Extensive chronic ischemic change. Chronic infarcts are present in the basal ganglia bilaterally, right greater than left. Chronic ischemia throughout the cerebral white matter right greater than left.  Small area of acute infarct in the left parietal white matter measuring 5 x 10 mm. Possible small area of acute infarct in the cingulate gyrus near the corpus callosum in the left frontal lobe. Small area of acute infarct in the left internal capsule.  Improvement in restricted diffusion in a sulcus on the right. This likely is an area of prior hemorrhage.  Negative for mass or edema.  Negative for hydrocephalus.  MRA HEAD FINDINGS  Image quality is significantly degraded by motion.  Left vertebral artery patent to the basilar. Right vertebral artery is severely diseased. Mild stenosis in the basilar. Superior cerebellar and posterior cerebral arteries are patent. Moderate disease in the left posterior cerebral artery .  Internal carotid artery patent bilaterally. Anterior and middle cerebral arteries are poorly visualized due to motion. There appears to be a severe stenosis of the right M1 segment. Mild disease in the left M1 segment. Both anterior cerebral arteries are patent and poorly evaluated on this study.  IMPRESSION: Small areas of acute infarct involving the left parietal white matter, left cingulate gyrus, and left internal capsule.  Advanced chronic ischemic change right greater than left. Prior hemorrhage on the right is noted with improvement.  MRA is severely limited due to motion. There is a probable severe stenosis in the right M1 segment and  mild disease in the left M1 segment. Severe disease distal right vertebral artery. Moderate disease in the left posterior cerebral artery.   Electronically Signed   By: Franchot Gallo M.D.   On: 03/03/2014 19:09   Mr Brain Wo Contrast  03/03/2014   CLINICAL DATA:  Bilateral leg weakness.  History of stroke  EXAM: MRI HEAD WITHOUT CONTRAST  MRA HEAD WITHOUT CONTRAST  TECHNIQUE: Multiplanar, multiecho pulse sequences of the brain and surrounding structures were obtained without intravenous contrast. Angiographic images of the head were obtained using MRA technique without contrast.  COMPARISON:  CT 03/03/2014.  MRI  11/28/2013  FINDINGS: MRI HEAD FINDINGS  Image quality degraded by motion. The patient had increasingly difficult time holding still as the study progressed.  Moderate atrophy. Extensive chronic ischemic change. Chronic infarcts are present in the basal ganglia bilaterally, right greater than left. Chronic ischemia throughout the cerebral white matter right greater than left.  Small area of acute infarct in the left parietal white matter measuring 5 x 10 mm. Possible small area of acute infarct in the cingulate gyrus near the corpus callosum in the left frontal lobe. Small area of acute infarct in the left internal capsule.  Improvement in restricted diffusion in a sulcus on the right. This likely is an area of prior hemorrhage.  Negative for mass or edema.  Negative for hydrocephalus.  MRA HEAD FINDINGS  Image quality is significantly degraded by motion.  Left vertebral artery patent to the basilar. Right vertebral artery is severely diseased. Mild stenosis in the basilar. Superior cerebellar and posterior cerebral arteries are patent. Moderate disease in the left posterior cerebral artery .  Internal carotid artery patent bilaterally. Anterior and middle cerebral arteries are poorly visualized due to motion. There appears to be a severe stenosis of the right M1 segment. Mild disease in the left M1  segment. Both anterior cerebral arteries are patent and poorly evaluated on this study.  IMPRESSION: Small areas of acute infarct involving the left parietal white matter, left cingulate gyrus, and left internal capsule.  Advanced chronic ischemic change right greater than left. Prior hemorrhage on the right is noted with improvement.  MRA is severely limited due to motion. There is a probable severe stenosis in the right M1 segment and mild disease in the left M1 segment. Severe disease distal right vertebral artery. Moderate disease in the left posterior cerebral artery.   Electronically Signed   By: Franchot Gallo M.D.   On: 03/03/2014 19:09     CBC  Recent Labs Lab 03/03/14 1025 03/04/14 0618 03/06/14 1943 03/07/14 0248 03/07/14 0947  WBC 4.4 4.3  --   --   --   HGB 14.1 14.0 15.7 15.0 14.8  HCT 42.9 42.1 47.6 44.1 43.9  PLT 232 238  --   --   --   MCV 84.1 83.9  --   --   --   MCH 27.6 27.9  --   --   --   MCHC 32.9 33.3  --   --   --   RDW 14.1 14.2  --   --   --   LYMPHSABS 1.5 1.5  --   --   --   MONOABS 0.5 0.5  --   --   --   EOSABS 0.1 0.1  --   --   --   BASOSABS 0.0 0.0  --   --   --     Chemistries   Recent Labs Lab 03/03/14 1025 03/04/14 0449  NA 140 137  K 4.0 4.3  CL 102 101  CO2 26 23  GLUCOSE 77 80  BUN 17 13  CREATININE 1.09 1.03  CALCIUM 9.2 9.4  AST  --  57*  ALT  --  57*  ALKPHOS  --  107  BILITOT  --  0.2*   ------------------------------------------------------------------------------------------------------------------ estimated creatinine clearance is 79.1 mL/min (by C-G formula based on Cr of 1.03). ------------------------------------------------------------------------------------------------------------------ No results for input(s): HGBA1C in the last 72 hours. ------------------------------------------------------------------------------------------------------------------ No results for input(s): CHOL, HDL, LDLCALC, TRIG, CHOLHDL,  LDLDIRECT in the last 72 hours. ------------------------------------------------------------------------------------------------------------------  No results for input(s): TSH, T4TOTAL, T3FREE, THYROIDAB in the last 72 hours.  Invalid input(s): FREET3 ------------------------------------------------------------------------------------------------------------------ No results for input(s): VITAMINB12, FOLATE, FERRITIN, TIBC, IRON, RETICCTPCT in the last 72 hours.  Coagulation profile  Recent Labs Lab 03/04/14 0618  INR 0.99   Lab Results  Component Value Date   HGBA1C 5.9* 03/04/2014    No results for input(s): DDIMER in the last 72 hours.  Cardiac Enzymes  Recent Labs Lab 03/05/14 1115 03/05/14 1549 03/06/14 0102  TROPONINI <0.30 <0.30 <0.30   ------------------------------------------------------------------------------------------------------------------ Invalid input(s): POCBNP     Time Spent in minutes  35   SINGH,PRASHANT K M.D on 03/07/2014 at 11:40 AM  Between 7am to 7pm - Pager - [phone]  After 7pm go to [email or web address] - password [email or web address]  And look for the night coverage person covering for me after hours  [facility] Office  [phone]

## 2014-03-07 NOTE — Progress Notes (Signed)
323cc of urine  Per bladder scan .

## 2014-03-07 NOTE — Progress Notes (Signed)
Called to the room by patient to assist in toileting.  Pt had already voided incontinent in the bed, lighter blood stained linen but a few blood clots were seen this occurrence.

## 2014-03-07 NOTE — Consult Note (Signed)
Urology Consult   Physician requesting consult: Dr. Candiss Norse  Reason for consult: Hematuria  History of Present Illness: Jermaine Lowery is a 58 y.o. with a history of prostate cancer s/p radiation seed implantation in 2012 by Dr. Karsten Ro.  He is now followed by the [place] in [place].  He has a long standing history of urinary incontinence and has had multiple strokes.  It is unknown whether he has been evaluated by urology at the [facility] and whether he has been evaluated for a neurogenic bladder (which is likely). He was admitted to the hospital on 03/03/14 with a new acute stoke.  He has been on DVT prophylaxis and antiplatelet therapy with Plavix and developed painless gross hematuria.  He denies any problems emptying his bladder although has continued to have what sounds like urge incontinence.  He did have one void that was measured and appeared dark red according to the nursing staff.  His last void was apparently lighter with some small clots.  His PVR was 38 cc most recently.  Past Medical History  Diagnosis Date  . Stroke   . Hypertension   . Prostate cancer     Past Surgical History  Procedure Laterality Date  . Rotator cuff repair    . Prostate cacner      Medications:  Home meds:    Medication List    STOP taking these medications        aspirin EC 81 MG tablet      TAKE these medications        amLODipine 10 MG tablet  Commonly known as:  NORVASC  Take 10 mg by mouth daily.     clopidogrel 75 MG tablet  Commonly known as:  PLAVIX  Take 1 tablet (75 mg total) by mouth daily.     pravastatin 80 MG tablet  Commonly known as:  PRAVACHOL  Take 40 mg by mouth daily.     sertraline 100 MG tablet  Commonly known as:  ZOLOFT  Take 50 mg by mouth daily.     tamsulosin 0.4 MG Caps capsule  Commonly known as:  FLOMAX  Take 1 capsule (0.4 mg total) by mouth at bedtime.        Scheduled Meds: . amLODipine  10 mg Oral Daily  . [START ON 03/09/2014] clopidogrel  75 mg  Oral Daily  . pravastatin  40 mg Oral Daily  . sertraline  50 mg Oral Daily  . tamsulosin  0.4 mg Oral QHS   Continuous Infusions: . sodium chloride 125 mL/hr at 03/07/14 1208   PRN Meds:.acetaminophen, senna-docusate  Allergies:  Allergies  Allergen Reactions  . Naproxen Rash    Family History  Problem Relation Age of Onset  . CAD Mother   . Diabetes Mellitus II Mother   . Diabetes Mellitus II Brother   . Stroke Paternal Uncle     Social History:  reports that he has quit smoking. He does not have any smokeless tobacco history on file. He reports that he does not drink alcohol or use illicit drugs.  ROS: A complete review of systems was performed.  All systems are negative except for pertinent findings as noted.  Physical Exam:  Vital signs in last 24 hours: Temp:  [97.7 F (36.5 C)-98.6 F (37 C)] 97.9 F (36.6 C) (11/07 1357) Pulse Rate:  [88-118] 94 (11/07 1357) Resp:  [18-20] 18 (11/07 1357) BP: (100-132)/(67-96) 121/84 mmHg (11/07 1357) SpO2:  [100 %] 100 % (11/07 1357) Constitutional:  Alert  and oriented, No acute distress Cardiovascular: Regular rate and rhythm, No JVD Respiratory: Normal respiratory effort, Lungs clear bilaterally GI: Abdomen is soft, nontender, nondistended, no abdominal masses Genitourinary: No CVAT. Normal male phallus, testes are descended bilaterally, scrotum is normal in appearance without lesions or masses, perineum is normal on inspection. Lymphatic: No lymphadenopathy Psychiatric: Normal mood and affect  Laboratory Data:   Recent Labs  03/06/14 1943 03/07/14 0248 03/07/14 0947  HGB 15.7 15.0 14.8  HCT 47.6 44.1 43.9    Results for orders placed or performed during the hospital encounter of 03/03/14 (from the past 24 hour(s))  Hemoglobin and hematocrit, blood     Status: None   Collection Time: 03/06/14  7:43 PM  Result Value Ref Range   Hemoglobin 15.7 13.0 - 17.0 g/dL   HCT 47.6 39.0 - 52.0 %  Type and screen      Status: None   Collection Time: 03/06/14  7:52 PM  Result Value Ref Range   ABO/RH(D) O POS    Antibody Screen NEG    Sample Expiration 03/09/2014   ABO/Rh     Status: None   Collection Time: 03/06/14  7:52 PM  Result Value Ref Range   ABO/RH(D) O POS   Hemoglobin and hematocrit, blood     Status: None   Collection Time: 03/07/14  2:48 AM  Result Value Ref Range   Hemoglobin 15.0 13.0 - 17.0 g/dL   HCT 44.1 39.0 - 52.0 %  Hemoglobin and hematocrit, blood     Status: None   Collection Time: 03/07/14  9:47 AM  Result Value Ref Range   Hemoglobin 14.8 13.0 - 17.0 g/dL   HCT 43.9 39.0 - 52.0 %   No results found for this or any previous visit (from the past 240 hour(s)).  Renal Function:  Recent Labs  03/03/14 1025 03/04/14 0449  CREATININE 1.09 1.03   Estimated Creatinine Clearance: 79.1 mL/min (by C-G formula based on Cr of 1.03).  Radiologic Imaging: No results found.  I independently reviewed the above imaging studies.  Impression/Recommendation  1. Gross hematuria: Likely related to history of radiation therapy and antiplatelet therapy/anticoagulation during hospitalization. However, he will require outpatient urologic evaluation with upper tract imaging and cystoscopy to rule out other potential etiologies.  If his hematuria continues to improve, he likely can continue antiplatelet therapy without the need for catheterization.  However, if his urine becomes more red and with more clots or if he develops difficulty emptying (keep checking PVRs), he will need a urethral catheter placed.   2. Urinary incontinence: He will need further outpatient urologic evaluation and urodynamics in a few months if not previously performed.    The patient and his family expressed their preference to receive their follow up urologic care at the [facility] and so he should be set up for an appt with outpatient urology at the [facility]  Mont Jagoda,LES 03/07/2014, 4:39 PM    Pryor Curia MD  CC: Dr. Candiss Norse

## 2014-03-07 NOTE — Progress Notes (Signed)
Assisted pt to stand to void, he voided 423ml in the urinal, dark red bloody color, post bladder scan 16ml.

## 2014-03-07 NOTE — Progress Notes (Signed)
Pt called and for help to restroom, went in pts room and he was incontinent again, light blood tinged-urine with no visible clots.  Post void residual performed revealed 171ml of residual urine.

## 2014-03-07 NOTE — Progress Notes (Signed)
Patient offered urinal  assisted him out of bed to a stand up position had started voiding incontinently. Voided an additional 200cc of bloody urine post voidal residual 68cc per bladder scan

## 2014-03-08 NOTE — Progress Notes (Signed)
Assisted patient to bathroom . Patient voided 400cc of blood tinged urine. Post void bladder scan revealed 30cc.

## 2014-03-08 NOTE — Progress Notes (Signed)
Pt to d/c to [facility] today via [facility].  Pt's sister left VM message.

## 2014-03-08 NOTE — Progress Notes (Signed)
  Subjective: Patient reports resolution of his hematuria. This is confirmed by the nursing staff. He has no new urologic complaints.  Objective: Vital signs in last 24 hours: Temp:  [97.6 F (36.4 C)-98.4 F (36.9 C)] 98.2 F (36.8 C) (11/08 0958) Pulse Rate:  [77-94] 87 (11/08 0958) Resp:  [17-18] 17 (11/08 0958) BP: (120-146)/(38-91) 126/80 mmHg (11/08 0958) SpO2:  [99 %-100 %] 100 % (11/08 0958)  Intake/Output from previous day: 11/07 0701 - 11/08 0700 In: 720 [P.O.:720] Out: 1025 [Urine:1025] Intake/Output this shift: Total I/O In: 120 [P.O.:120] Out: -   Physical Exam:  Constitutional: Vital signs reviewed. WD WN in NAD     Lab Results:  Recent Labs  03/06/14 1943 03/07/14 0248 03/07/14 0947  HGB 15.7 15.0 14.8  HCT 47.6 44.1 43.9   BMET No results for input(s): NA, K, CL, CO2, GLUCOSE, BUN, CREATININE, CALCIUM in the last 72 hours. No results for input(s): LABPT, INR in the last 72 hours. No results for input(s): LABURIN in the last 72 hours. No results found for this or any previous visit.  Studies/Results: No results found.  Assessment/Plan:  Gross hematuria now resolved. He is can receive his ongoing urology care at the [facility] he is to be discharged today.   LOS: 5 days   Tesla Keeler S 03/08/2014, 11:24 AM

## 2014-03-08 NOTE — Progress Notes (Signed)
Pt is being discharged to [facility] in [place] per families request.  Called and gave report to nurse Amy, all questions were answered and pertinet information shared.  Family was notified Roxan Jermaine Lowery) and patient will be transported later today.

## 2014-03-08 NOTE — Discharge Summary (Signed)
Jermaine Lowery, is a 58 y.o. male  DOB Apr 13, 1956  MRN [id number].  Admission date:  03/03/2014  Admitting Physician  Berle Mull, MD  Discharge Date:  03/08/2014   Primary MD  [name], MD  Recommendations for primary care physician for things to follow:   Monitor secondary risk factors for stroke, needs outpatient neurology and urology follow-up   Admission Diagnosis  Stroke [I63.9] Acute ischemic stroke [I63.50] Leg weakness, bilateral [R29.898] Rectal incontinence [R15.9] Head injuries, initial encounter [S09.90XA]   Discharge Diagnosis  Stroke [I63.9] Acute ischemic stroke [I63.50] Leg weakness, bilateral [R29.898] Rectal incontinence [R15.9] Head injuries, initial encounter [S09.90XA]    Principal Problem:   Acute ischemic stroke Active Problems:   HTN (hypertension)   HLD (hyperlipidemia)   Regional wall motion abnormality of heart      Past Medical History  Diagnosis Date  . Stroke   . Hypertension   . Prostate cancer     Past Surgical History  Procedure Laterality Date  . Rotator cuff repair    . Prostate cacner         History of present illness and  Hospital Course:     Kindly see H&P for history of present illness and admission details, please review complete Labs, Consult reports and Test reports for all details in brief  HPI  from the history and physical done on the day of admission   Jermaine Lowery is a 58 y.o. male with Past medical history of CVA, hypertension. The patient presented with complaints of urinary incontinence and CVA with generalized weakness. He has a history of prior stroke with residual left-sided weakness.Marland Kitchen History was up and from the help of sister who friend that since last 3 weeks he has been having progressively difficulty walking around and doing his  daily ADLs. He also has been having increasing episodes of retention of urine as well as bowel and followed by incontinence episodes. There is no episode of seizure-like activity. But patient has been having recurrent fall in which he will be standing up and then fall backwards when walking. There is no recent change in his medication but patient is only on aspirin and not on aspirin and Plavix that he was discharged on August. [facility] and has [facility] issues. He uses a walker to walk and there is no significant worsening of his weakness on the left as per the sister as well as patient. No fever no chills no chest pain or shortness of breath and abdominal pain no nausea no vomiting. No speech difficulty and no vision difficulty. The patient is coming from home. And at his baseline independent for most of his ADL.   Hospital Course   1. 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 Was stable on tele, was seen by PT, OT, speech , did not qualify for placement,stable  A1c and lipid panel, noted MRI MRA brain, nonacute Echogram and carotid duplex, neurology wants Plavix only at this time. Continue home dose statin and follow with neurology outpatient. Now back to baseline.    Recent Labs    Lab Results  Component Value Date   HGBA1C 5.9* 03/04/2014       Recent Labs    Lab Results  Component Value Date   CHOL 137 03/04/2014   HDL 50 03/04/2014   LDLCALC 58 03/04/2014   TRIG 146 03/04/2014   CHOLHDL 2.7 03/04/2014        2. Urinary retention . Resolved on Flomax, never needed Foley.     3. Dyslipidemia. Continue home dose statin.    4. Essential hypertension.on Norvasc continue.    5. Wall motion abnormality noted on Echo - new as compared to  echogram few months ago, he is chest pain-free, EKG nonacute, check one set of troponin, continue aspirin and statin. Cardiology consulted, outpatient follow-up.    6. Hematuria. Developed 03/06/2014, likely due to comminution of antiplatelet medications and Lovenox for DVT prophylaxis, has history of prostate resection in the past and prostate radiation, after holding offending medications for a hematuria has completely resolved, post void residuals less than 100 mL, seen by urology. Needs outpatient urology follow-up.         Discharge Condition: stable   Follow UP  Follow-up Information    Follow up with [facility].   Contact information:   [address] [place] [address] [phone]       Follow up with Jeannetta Nap, HARVEY, MD. Schedule an appointment as soon as possible for a visit in 1 week.   Specialty:  Family Medicine   Contact information:   [place] [address] [phone]       Follow up with [facility]. Schedule an appointment as soon as possible for a visit in 3 weeks.   Contact information:   [address] [place] [address] [phone]      Follow up with Candee Furbish, MD. Schedule an appointment as soon as possible for a visit in 1 week.   Specialty:  Cardiology   Contact information:   [address] [address] [phone]       Follow up with Dutch Gray, MD. Schedule an appointment as soon as possible for a visit in 1 week.   Specialty:  Urology   Contact information:   [place] [address] [phone]         Discharge Instructions  and  Discharge Medications      Discharge Instructions    Ambulatory referral to Neurology    Complete by:  As directed   Stroke patient. Dr. Leonie Man prefers follow up in 1 month     Ambulatory referral to Physical Therapy    Complete by:  As directed   Worsening ataxia,  falls     Diet - low sodium heart healthy    Complete by:  As directed      Diet - low sodium heart healthy    Complete by:  As directed      Discharge instructions    Complete by:  As directed   Follow with Primary MD MIRANDA, HARVEY, MD in 7 days   Get CBC, CMP, 2 view Chest X ray checked  by Primary MD next visit.    Activity: As tolerated with Full  fall precautions use walker/cane & assistance as needed   Disposition home with HHPT    Diet: Heart Healthy   with feeding assistance and aspiration precautions as needed.  For Heart failure patients - Check your Weight same time everyday, if you gain over 2 pounds, or you develop in leg swelling, experience more shortness of breath or chest pain, call your Primary MD immediately. Follow Cardiac Low Salt Diet and 1.8 lit/day fluid restriction.   On your next visit with your primary care physician please Get Medicines reviewed and adjusted.   Please request your Prim.MD to go over all Hospital Tests and Procedure/Radiological results at the follow up, please get all Hospital records sent to your Prim MD by signing hospital release before you go home.   If you experience worsening of your admission symptoms, develop shortness of breath, life threatening emergency, suicidal or homicidal thoughts you must seek medical attention immediately by calling 911 or calling your MD immediately  if symptoms less severe.  You Must read complete instructions/literature along with all the possible adverse reactions/side effects for all the Medicines you take and that have been prescribed to you. Take any new Medicines after you have completely understood and accpet all the possible adverse reactions/side effects.   Do not drive, operating heavy machinery, perform activities at heights, swimming or participation in water activities or provide baby sitting services if your were admitted for syncope or siezures until you have seen by Primary MD or a  Neurologist and advised to do so again.  Do not drive when taking Pain medications.    Do not take more than prescribed Pain, Sleep and Anxiety Medications  Special Instructions: If you have smoked or chewed Tobacco  in the last 2 yrs please stop smoking, stop any regular Alcohol  and or any Recreational drug use.  Wear Seat belts while driving.   Please note  You were cared for by a hospitalist during your hospital stay. If you have any questions about your discharge medications or the care you received while you were in the hospital after you are discharged, you can call the unit and asked to speak with the hospitalist on call if the hospitalist that took care of you is not available. Once you are discharged, your primary care physician will handle any further medical issues. Please note that NO REFILLS for any discharge medications will be authorized once you are discharged, as it is imperative that you return to your primary care physician (or establish a relationship with a primary care physician if you do not have one) for your aftercare needs so that they can reassess your need for medications and monitor your lab values.     Discharge instructions    Complete by:  As directed   Follow with Primary MD MIRANDA, HARVEY, MD in 7 days   Get CBC, CMP, 2 view Chest X ray checked  by Primary MD next visit.    Activity: As tolerated with Full fall precautions use walker/cane & assistance as needed   Disposition home with HHPT    Diet: Heart Healthy   with feeding assistance and aspiration precautions as needed.  For Heart failure patients - Check your Weight same time everyday, if you gain over 2 pounds, or you develop in leg swelling, experience more shortness of breath or chest pain, call your Primary MD immediately. Follow Cardiac Low Salt Diet and 1.8 lit/day fluid restriction.   On your next visit with your primary care physician  please Get Medicines reviewed and  adjusted.   Please request your Prim.MD to go over all Hospital Tests and Procedure/Radiological results at the follow up, please get all Hospital records sent to your Prim MD by signing hospital release before you go home.   If you experience worsening of your admission symptoms, develop shortness of breath, life threatening emergency, suicidal or homicidal thoughts you must seek medical attention immediately by calling 911 or calling your MD immediately  if symptoms less severe.  You Must read complete instructions/literature along with all the possible adverse reactions/side effects for all the Medicines you take and that have been prescribed to you. Take any new Medicines after you have completely understood and accpet all the possible adverse reactions/side effects.   Do not drive, operating heavy machinery, perform activities at heights, swimming or participation in water activities or provide baby sitting services if your were admitted for syncope or siezures until you have seen by Primary MD or a Neurologist and advised to do so again.  Do not drive when taking Pain medications.    Do not take more than prescribed Pain, Sleep and Anxiety Medications  Special Instructions: If you have smoked or chewed Tobacco  in the last 2 yrs please stop smoking, stop any regular Alcohol  and or any Recreational drug use.  Wear Seat belts while driving.   Please note  You were cared for by a hospitalist during your hospital stay. If you have any questions about your discharge medications or the care you received while you were in the hospital after you are discharged, you can call the unit and asked to speak with the hospitalist on call if the hospitalist that took care of you is not available. Once you are discharged, your primary care physician will handle any further medical issues. Please note that NO REFILLS for any discharge medications will be authorized once you are discharged, as it is  imperative that you return to your primary care physician (or establish a relationship with a primary care physician if you do not have one) for your aftercare needs so that they can reassess your need for medications and monitor your lab values.     Increase activity slowly    Complete by:  As directed      Increase activity slowly    Complete by:  As directed             Medication List    STOP taking these medications        aspirin EC 81 MG tablet      TAKE these medications        amLODipine 10 MG tablet  Commonly known as:  NORVASC  Take 10 mg by mouth daily.     clopidogrel 75 MG tablet  Commonly known as:  PLAVIX  Take 1 tablet (75 mg total) by mouth daily.     pravastatin 80 MG tablet  Commonly known as:  PRAVACHOL  Take 40 mg by mouth daily.     sertraline 100 MG tablet  Commonly known as:  ZOLOFT  Take 50 mg by mouth daily.     tamsulosin 0.4 MG Caps capsule  Commonly known as:  FLOMAX  Take 1 capsule (0.4 mg total) by mouth at bedtime.          Diet and Activity recommendation: See Discharge Instructions above   Consults obtained -  Neuro   Major procedures and Radiology Reports - PLEASE review detailed and final reports  for all details, in brief -   Carotids   Carotid Duplex (Doppler) has been completed. Preliminary findings: Bilateral: 1-39% ICA stenosis. Vertebral artery flow is antegrade.   TTE  - Left ventricle: The cavity size was normal. Wall thickness was normal. Systolic function was normal. The estimated ejection fraction was in the range of 60% to 65%. There is hypokinesis of the basalinferior myocardium. Doppler parameters are consistent with abnormal left ventricular relaxation (grade 1 diastolic dysfunction).  Impressions: No cardiac source of emboli was indentified.   Dg Thoracic Spine W/swimmers  03/03/2014   CLINICAL DATA:  Status post fall 1 week ago now with onset of incontinence of urine and stool beginning  yesterday ; history of previous CVA  EXAM: THORACIC SPINE - 2 VIEW + SWIMMERS  COMPARISON:  PA and lateral chest x-rays of November 28, 2013  FINDINGS: The thoracic vertebral bodies are preserved in height. The intervertebral disc space heights are reasonably well maintained. There is gentle S-shaped thoracolumbar curvature. There are no abnormal paravertebral soft tissue densities. There is a bridging osteophyte on the left at T9-T10.  IMPRESSION: There is no evidence of an acute compression fracture of the thoracic spine nor other acute thoracic spine abnormality.   Electronically Signed   By: David  Martinique   On: 03/03/2014 11:56   Dg Lumbar Spine Complete  03/03/2014   CLINICAL DATA:  Fall 1 week ago with trauma to head. The patient is now in kind at the bowel and bladder.  EXAM: LUMBAR SPINE - COMPLETE 4+ VIEW  COMPARISON:  None.  FINDINGS: Five non rib-bearing lumbar type vertebral bodies are present. Vertebral body heights are normal. There is slight anterolisthesis at L4-5 and L5-S1. Degenerative facet changes are present in the lower lumbar spine. Slight anterolisthesis is noted at L4-5. Alignment is otherwise anatomic. Atherosclerotic calcifications are present in the aorta and branch vessels without aneurysm.  IMPRESSION: 1. Degenerative changes in the lower lumbar spine, particularly at L4-5 and L5-S1. 2. No acute abnormality. 3. Atherosclerosis.   Electronically Signed   By: Lawrence Santiago M.D.   On: 03/03/2014 11:56   Ct Head Wo Contrast  03/03/2014   CLINICAL DATA:  Head injury related to fall 1 week ago. Initial encounter  EXAM: CT HEAD WITHOUT CONTRAST  TECHNIQUE: Contiguous axial images were obtained from the base of the skull through the vertex without intravenous contrast.  COMPARISON:  02/03/2013  FINDINGS: Skull and Sinuses:Negative for fracture or destructive process. The mastoids, middle ears, and imaged paranasal sinuses are clear.  Orbits: No acute abnormality.  Brain: No evidence of acute  abnormality, such as acute infarction, hemorrhage, hydrocephalus, or mass lesion/mass effect.  Newly visible by CT, but remote, there is a small infarct involving the cortex and subcortical white matter of the high and posterior right frontal lobe. This was noted on brain MRI 11/28/2013. There is extensive remote small vessel ischemic injury to the deep gray nuclei and deep white matter tracts. The pattern is stable from 2014, with infarcts involving the bilateral putamen, bilateral internal capsule, right caudate head, and periventricular white matter. Generalized brain atrophy.  IMPRESSION: 1. No acute intracranial findings. 2. Extensive remote ischemic injury, as described above.   Electronically Signed   By: Jorje Guild M.D.   On: 03/03/2014 11:32   [name] Head Wo Contrast  03/03/2014   CLINICAL DATA:  Bilateral leg weakness.  History of stroke  EXAM: MRI HEAD WITHOUT CONTRAST  MRA HEAD WITHOUT CONTRAST  TECHNIQUE: Multiplanar,  multiecho pulse sequences of the brain and surrounding structures were obtained without intravenous contrast. Angiographic images of the head were obtained using MRA technique without contrast.  COMPARISON:  CT 03/03/2014.  MRI 11/28/2013  FINDINGS: MRI HEAD FINDINGS  Image quality degraded by motion. The patient had increasingly difficult time holding still as the study progressed.  Moderate atrophy. Extensive chronic ischemic change. Chronic infarcts are present in the basal ganglia bilaterally, right greater than left. Chronic ischemia throughout the cerebral white matter right greater than left.  Small area of acute infarct in the left parietal white matter measuring 5 x 10 mm. Possible small area of acute infarct in the cingulate gyrus near the corpus callosum in the left frontal lobe. Small area of acute infarct in the left internal capsule.  Improvement in restricted diffusion in a sulcus on the right. This likely is an area of prior hemorrhage.  Negative for mass or edema.   Negative for hydrocephalus.  MRA HEAD FINDINGS  Image quality is significantly degraded by motion.  Left vertebral artery patent to the basilar. Right vertebral artery is severely diseased. Mild stenosis in the basilar. Superior cerebellar and posterior cerebral arteries are patent. Moderate disease in the left posterior cerebral artery .  Internal carotid artery patent bilaterally. Anterior and middle cerebral arteries are poorly visualized due to motion. There appears to be a severe stenosis of the right M1 segment. Mild disease in the left M1 segment. Both anterior cerebral arteries are patent and poorly evaluated on this study.  IMPRESSION: Small areas of acute infarct involving the left parietal white matter, left cingulate gyrus, and left internal capsule.  Advanced chronic ischemic change right greater than left. Prior hemorrhage on the right is noted with improvement.  MRA is severely limited due to motion. There is a probable severe stenosis in the right M1 segment and mild disease in the left M1 segment. Severe disease distal right vertebral artery. Moderate disease in the left posterior cerebral artery.   Electronically Signed   By: Franchot Gallo M.D.   On: 03/03/2014 19:09   Mr Brain Wo Contrast  03/03/2014   CLINICAL DATA:  Bilateral leg weakness.  History of stroke  EXAM: MRI HEAD WITHOUT CONTRAST  MRA HEAD WITHOUT CONTRAST  TECHNIQUE: Multiplanar, multiecho pulse sequences of the brain and surrounding structures were obtained without intravenous contrast. Angiographic images of the head were obtained using MRA technique without contrast.  COMPARISON:  CT 03/03/2014.  MRI 11/28/2013  FINDINGS: MRI HEAD FINDINGS  Image quality degraded by motion. The patient had increasingly difficult time holding still as the study progressed.  Moderate atrophy. Extensive chronic ischemic change. Chronic infarcts are present in the basal ganglia bilaterally, right greater than left. Chronic ischemia throughout the  cerebral white matter right greater than left.  Small area of acute infarct in the left parietal white matter measuring 5 x 10 mm. Possible small area of acute infarct in the cingulate gyrus near the corpus callosum in the left frontal lobe. Small area of acute infarct in the left internal capsule.  Improvement in restricted diffusion in a sulcus on the right. This likely is an area of prior hemorrhage.  Negative for mass or edema.  Negative for hydrocephalus.  MRA HEAD FINDINGS  Image quality is significantly degraded by motion.  Left vertebral artery patent to the basilar. Right vertebral artery is severely diseased. Mild stenosis in the basilar. Superior cerebellar and posterior cerebral arteries are patent. Moderate disease in the left posterior cerebral artery .  Internal carotid artery patent bilaterally. Anterior and middle cerebral arteries are poorly visualized due to motion. There appears to be a severe stenosis of the right M1 segment. Mild disease in the left M1 segment. Both anterior cerebral arteries are patent and poorly evaluated on this study.  IMPRESSION: Small areas of acute infarct involving the left parietal white matter, left cingulate gyrus, and left internal capsule.  Advanced chronic ischemic change right greater than left. Prior hemorrhage on the right is noted with improvement.  MRA is severely limited due to motion. There is a probable severe stenosis in the right M1 segment and mild disease in the left M1 segment. Severe disease distal right vertebral artery. Moderate disease in the left posterior cerebral artery.   Electronically Signed   By: Franchot Gallo M.D.   On: 03/03/2014 19:09    Micro Results      No results found for this or any previous visit (from the past 240 hour(s)).     Today   Subjective:   Jermaine Lowery today has no headache,no chest abdominal pain,no new weakness tingling or numbness, feels much better wants to go home today.    Objective:    Blood pressure 126/80, pulse 87, temperature 98.2 F (36.8 C), temperature source Oral, resp. rate 17, height 5\' 9"  (1.753 m), weight 71.487 kg (157 lb 9.6 oz), SpO2 100 %.   Intake/Output Summary (Last 24 hours) at 03/08/14 1044 Last data filed at 03/08/14 0959  Gross per 24 hour  Intake    600 ml  Output    600 ml  Net      0 ml    Exam  Awake Alert, Oriented x 3, No new F.N deficits, has chronic left-sided weakness, mild right-sided weakness now, flat affect [place].AT,PERRAL Supple Neck,No JVD, No cervical lymphadenopathy appriciated.  Symmetrical Chest wall movement, Good air movement bilaterally, CTAB RRR,No Gallops,Rubs or new Murmurs, No Parasternal Heave +ve B.Sounds, Abd Soft, Non tender, No organomegaly appriciated, No rebound -guarding or rigidity. No Cyanosis, Clubbing or edema, No new Rash or bruise   Data Review   CBC w Diff: Lab Results  Component Value Date   WBC 4.3 03/04/2014   HGB 14.8 03/07/2014   HCT 43.9 03/07/2014   PLT 238 03/04/2014   LYMPHOPCT 36 03/04/2014   MONOPCT 12 03/04/2014   EOSPCT 3 03/04/2014   BASOPCT 1 03/04/2014    CMP: Lab Results  Component Value Date   NA 137 03/04/2014   K 4.3 03/04/2014   CL 101 03/04/2014   CO2 23 03/04/2014   BUN 13 03/04/2014   CREATININE 1.03 03/04/2014   PROT 7.0 03/04/2014   ALBUMIN 3.4* 03/04/2014   BILITOT 0.2* 03/04/2014   ALKPHOS 107 03/04/2014   AST 57* 03/04/2014   ALT 57* 03/04/2014  . Lab Results  Component Value Date   CHOL 137 03/04/2014   HDL 50 03/04/2014   LDLCALC 58 03/04/2014   TRIG 146 03/04/2014   CHOLHDL 2.7 03/04/2014    Lab Results  Component Value Date   HGBA1C 5.9* 03/04/2014     Total Time in preparing paper work, data evaluation and todays exam - 35 minutes  Thurnell Lose M.D on 03/08/2014 at 10:44 AM  [facility] Office  [phone]

## 2014-03-08 NOTE — Progress Notes (Signed)
Patient voided incontnently large amount of bloody urine  Bladder scan revealed 120cc

## 2014-03-18 ENCOUNTER — Encounter: Payer: Non-veteran care | Admitting: *Deleted

## 2014-03-20 ENCOUNTER — Encounter: Payer: Non-veteran care | Admitting: *Deleted

## 2014-04-01 ENCOUNTER — Encounter: Payer: Non-veteran care | Admitting: Occupational Therapy

## 2014-04-02 ENCOUNTER — Encounter: Payer: Non-veteran care | Admitting: Occupational Therapy

## 2014-04-08 ENCOUNTER — Encounter: Payer: Non-veteran care | Admitting: Occupational Therapy

## 2014-04-09 ENCOUNTER — Encounter: Payer: Non-veteran care | Admitting: Occupational Therapy

## 2014-04-13 ENCOUNTER — Ambulatory Visit: Payer: Non-veteran care | Admitting: Occupational Therapy

## 2014-04-13 ENCOUNTER — Encounter: Payer: Non-veteran care | Admitting: Occupational Therapy

## 2014-04-15 ENCOUNTER — Encounter: Payer: Non-veteran care | Admitting: Occupational Therapy

## 2014-04-20 ENCOUNTER — Encounter: Payer: Non-veteran care | Admitting: Occupational Therapy

## 2014-04-22 ENCOUNTER — Encounter: Payer: Non-veteran care | Admitting: Occupational Therapy

## 2014-04-27 ENCOUNTER — Encounter: Payer: Non-veteran care | Admitting: Occupational Therapy

## 2014-04-30 ENCOUNTER — Encounter: Payer: Non-veteran care | Admitting: Occupational Therapy

## 2014-05-11 ENCOUNTER — Ambulatory Visit: Payer: Non-veteran care | Admitting: Occupational Therapy

## 2015-02-22 NOTE — Therapy (Signed)
[facility] [address] [place], [place], [address] Phone: [phone]   Fax:  [phone]  Patient Details  Name: Jermaine Lowery MRN: [id number] Date of Birth: 09-29-1955 Referring Provider:  [name], MD  Encounter Date: 03/03/2014 OCCUPATIONAL THERAPY DISCHARGE SUMMARY  Visits from Start of Care: 1  Current functional level related to goals / functional outcomes: Patient did not return for subsequent OT visits    Remaining deficits: Patient did not return for subsequent OT visits    Education / Equipment: See eval Plan:                                                    Patient goals were not met. Patient is being discharged due to not returning since the last visit.  ?????       Mariah Milling, OTR/L 02/22/2015, 12:38 PM  [facility] [address] [place], [place], [address] Phone: [phone]   Fax:  [phone]

## 2015-03-29 IMAGING — CR DG THORACIC SPINE 3V
3 series · 3 of 3 positions shown · non-contrast
Comparison: PA and lateral chest x-rays November 28, 2013

CLINICAL DATA: Status post fall 1 week ago now with onset of
incontinence of urine and stool beginning yesterday ; history of
previous CVA

EXAM:
THORACIC SPINE - 2 VIEW + SWIMMERS

[t thoracic spine lat]
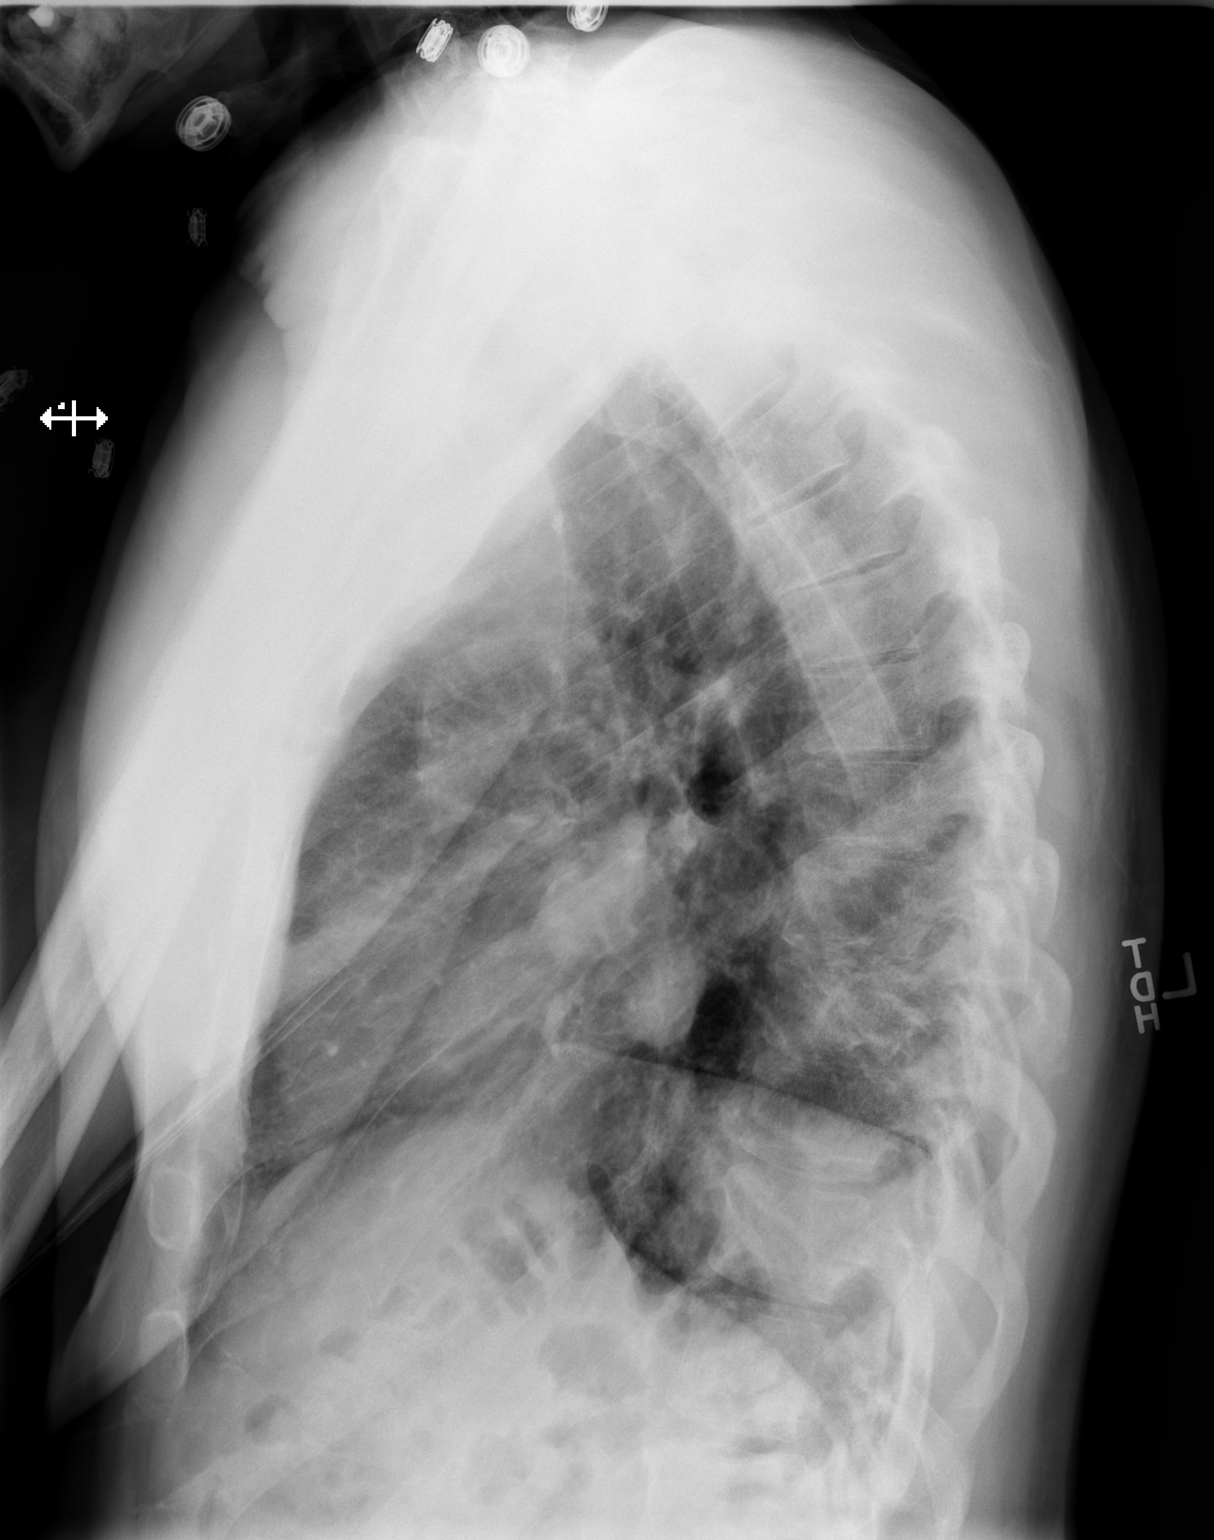

[t thoracic swimmers]
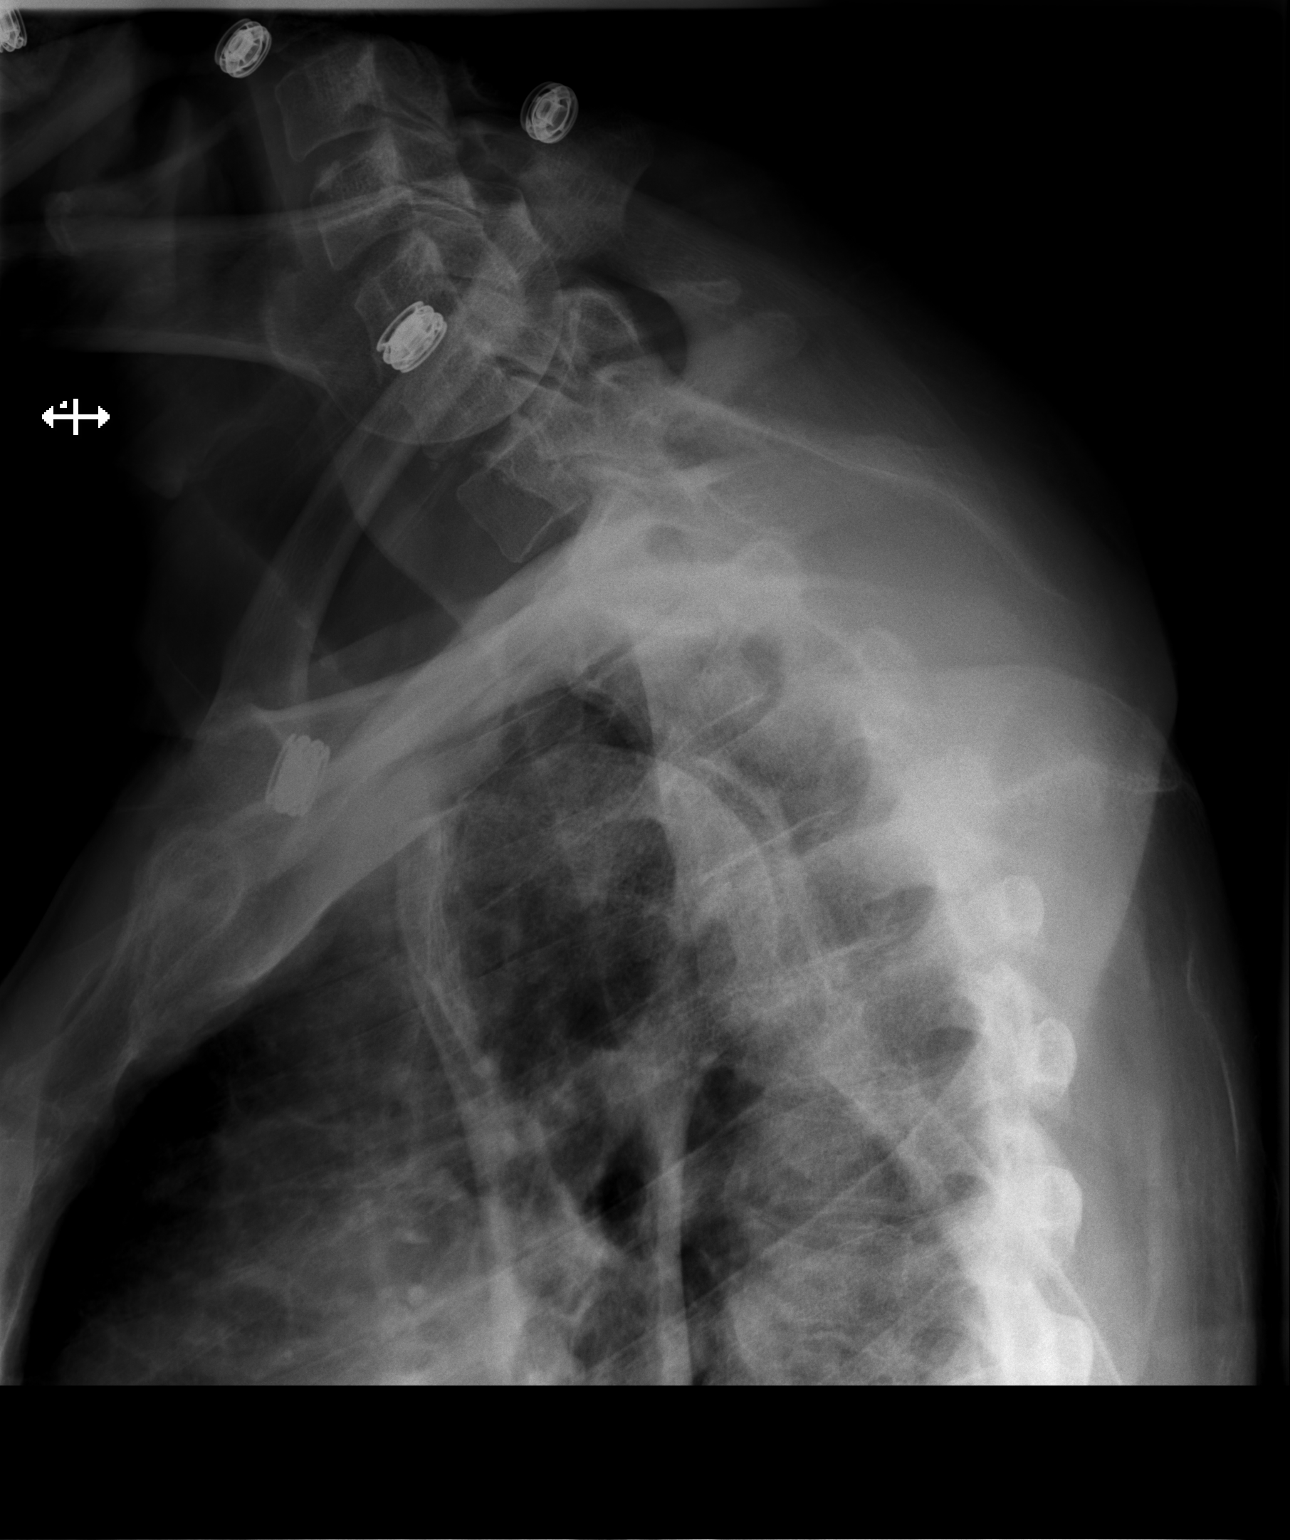

[t thoracic spine ap]
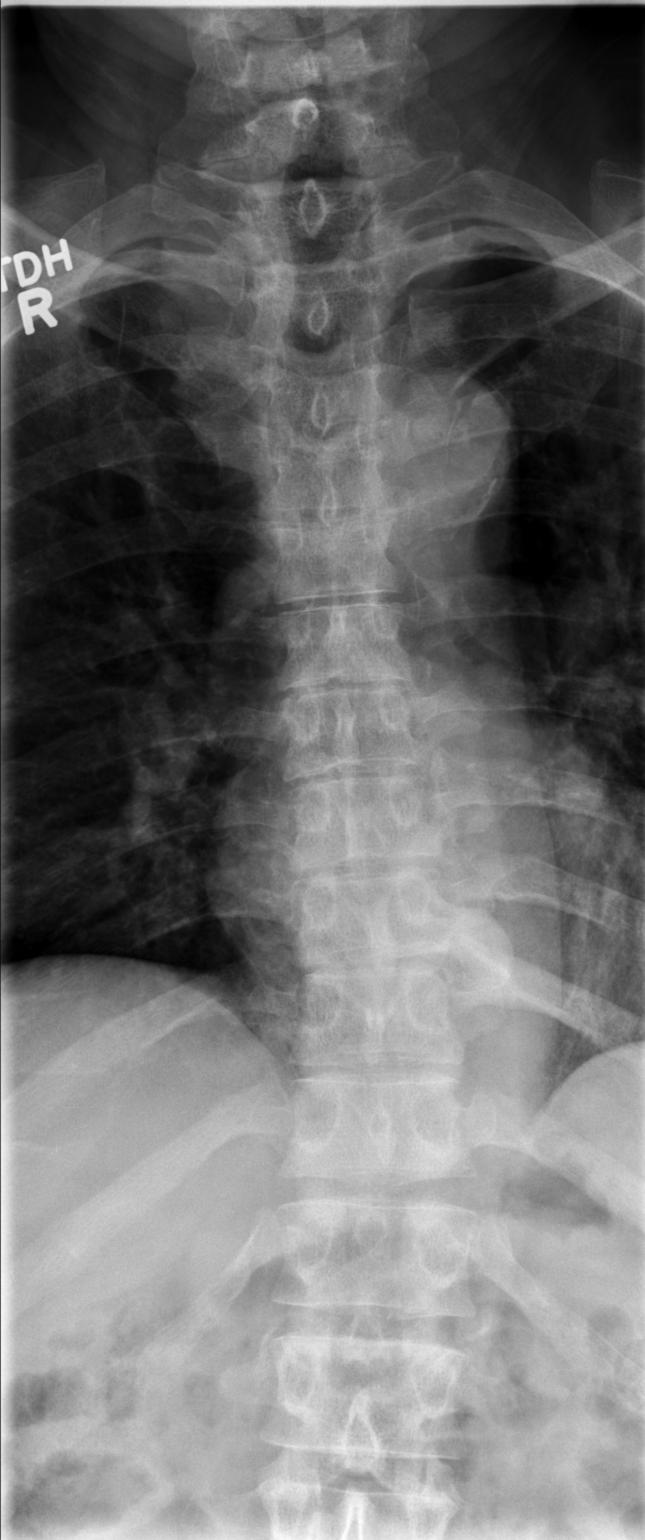

[3 of 3 positions shown; findings below may reference images not displayed]

FINDINGS: The thoracic vertebral bodies are preserved in height. The
intervertebral disc space heights are reasonably well maintained.
There is gentle S-shaped thoracolumbar curvature. There are no
abnormal paravertebral soft tissue densities. There is a bridging
osteophyte on the left at T9-T10.
IMPRESSION: There is no evidence of an acute compression fracture of the
thoracic spine nor other acute thoracic spine abnormality.

## 2020-11-29 DEATH — deceased
